# Patient Record
Sex: Female | Born: 1970
Health system: Southern US, Community
[De-identification: ages and names within clinical notes are randomized; demographics above are authoritative.]

## PROBLEM LIST (undated history)

## (undated) DIAGNOSIS — J683 Other acute and subacute respiratory conditions due to chemicals, gases, fumes and vapors: Secondary | ICD-10-CM

## (undated) DIAGNOSIS — R42 Dizziness and giddiness: Secondary | ICD-10-CM

## (undated) DIAGNOSIS — R011 Cardiac murmur, unspecified: Secondary | ICD-10-CM

## (undated) DIAGNOSIS — E669 Obesity, unspecified: Secondary | ICD-10-CM

## (undated) DIAGNOSIS — T7840XA Allergy, unspecified, initial encounter: Secondary | ICD-10-CM

## (undated) DIAGNOSIS — K219 Gastro-esophageal reflux disease without esophagitis: Secondary | ICD-10-CM

## (undated) DIAGNOSIS — R51 Headache: Principal | ICD-10-CM

## (undated) HISTORY — DX: Cardiac murmur, unspecified: R01.1

## (undated) HISTORY — PX: TONSILLECTOMY AND ADENOIDECTOMY: SUR1326

## (undated) HISTORY — DX: Dizziness and giddiness: R42

## (undated) HISTORY — DX: Headache: R51

## (undated) HISTORY — DX: Gastro-esophageal reflux disease without esophagitis: K21.9

## (undated) HISTORY — PX: ENDOMETRIAL ABLATION: SHX621

## (undated) HISTORY — DX: Allergy, unspecified, initial encounter: T78.40XA

## (undated) HISTORY — DX: Obesity, unspecified: E66.9

## (undated) HISTORY — DX: Other acute and subacute respiratory conditions due to chemicals, gases, fumes and vapors: J68.3

---

## 2004-07-21 HISTORY — PX: FOOT SURGERY: SHX648

## 2009-05-09 ENCOUNTER — Ambulatory Visit (HOSPITAL_COMMUNITY): Admission: RE | Admit: 2009-05-09 | Discharge: 2009-05-09 | Payer: Self-pay | Admitting: Family Medicine

## 2010-09-27 ENCOUNTER — Other Ambulatory Visit (HOSPITAL_COMMUNITY): Payer: Self-pay | Admitting: Family Medicine

## 2010-09-27 DIAGNOSIS — E049 Nontoxic goiter, unspecified: Secondary | ICD-10-CM

## 2010-09-30 ENCOUNTER — Ambulatory Visit (HOSPITAL_COMMUNITY)
Admission: RE | Admit: 2010-09-30 | Discharge: 2010-09-30 | Disposition: A | Discharge status: Home / Self Care | Payer: BC Managed Care – PPO | Source: Ambulatory Visit | Attending: Family Medicine | Admitting: Family Medicine

## 2010-09-30 DIAGNOSIS — E049 Nontoxic goiter, unspecified: Secondary | ICD-10-CM

## 2010-11-13 ENCOUNTER — Ambulatory Visit (INDEPENDENT_AMBULATORY_CARE_PROVIDER_SITE_OTHER): Payer: BC Managed Care – PPO | Admitting: Internal Medicine

## 2010-11-13 ENCOUNTER — Other Ambulatory Visit (INDEPENDENT_AMBULATORY_CARE_PROVIDER_SITE_OTHER): Payer: Self-pay | Admitting: Internal Medicine

## 2010-11-13 DIAGNOSIS — K219 Gastro-esophageal reflux disease without esophagitis: Secondary | ICD-10-CM

## 2010-11-13 DIAGNOSIS — R1310 Dysphagia, unspecified: Secondary | ICD-10-CM

## 2010-11-14 ENCOUNTER — Ambulatory Visit (HOSPITAL_COMMUNITY)
Admission: RE | Admit: 2010-11-14 | Discharge: 2010-11-14 | Disposition: A | Discharge status: Home / Self Care | Payer: BC Managed Care – PPO | Source: Ambulatory Visit | Attending: Internal Medicine | Admitting: Internal Medicine

## 2010-11-14 DIAGNOSIS — R1310 Dysphagia, unspecified: Secondary | ICD-10-CM | POA: Insufficient documentation

## 2010-11-14 DIAGNOSIS — K219 Gastro-esophageal reflux disease without esophagitis: Secondary | ICD-10-CM | POA: Insufficient documentation

## 2010-12-19 ENCOUNTER — Encounter (HOSPITAL_BASED_OUTPATIENT_CLINIC_OR_DEPARTMENT_OTHER): Payer: BC Managed Care – PPO | Admitting: Internal Medicine

## 2010-12-19 ENCOUNTER — Ambulatory Visit (HOSPITAL_COMMUNITY)
Admission: RE | Admit: 2010-12-19 | Discharge: 2010-12-19 | Disposition: A | Discharge status: Home / Self Care | Payer: BC Managed Care – PPO | Source: Ambulatory Visit | Attending: Internal Medicine | Admitting: Internal Medicine

## 2010-12-19 DIAGNOSIS — K219 Gastro-esophageal reflux disease without esophagitis: Secondary | ICD-10-CM

## 2010-12-19 DIAGNOSIS — R1310 Dysphagia, unspecified: Secondary | ICD-10-CM

## 2010-12-19 DIAGNOSIS — R1013 Epigastric pain: Secondary | ICD-10-CM | POA: Insufficient documentation

## 2011-01-13 NOTE — Op Note (Signed)
NAME:  Ana Gross, Ana Gross             ACCOUNT NO.:  [id number]  MEDICAL RECORD NO.:  [id number]           PATIENT TYPE:  O  LOCATION:  [facility]                          FACILITY:  [facility]  PHYSICIAN:  Lionel December, M.D.    DATE OF BIRTH:  Dec 08, 1970  DATE OF PROCEDURE:  12/19/2010 DATE OF DISCHARGE:                              OPERATIVE REPORT   PROCEDURE:  Esophagogastroduodenoscopy.  INDICATION:  Ingra is a 40 year old Caucasian female who has been having "rawness" in her chest for about 6 months.  This is worse in the morning.  She has been treated for GERD and currently on omeprazole twice daily, but does not report any improvement.  She denies dysphagia, heartburn, nausea, or vomiting.  She does not experience any breathing difficulty or postural symptoms.  She is undergoing diagnostic EGD to find out if she has erosive esophagitis or peptic ulcer disease that might explain her symptoms.  Procedure risks were reviewed with the patient.  Informed consent was obtained.  MEDICATIONS FOR CONSCIOUS SEDATION:  Cetacaine spray for pharyngeal topical anesthesia, Demerol 50 mg IV, and Versed 5 mg IV.  FINDINGS:  Procedure performed in endoscopy suite.  The patient's vital signs and O2 saturations were monitored during the procedure and remained stable.  The patient was placed in left lateral recumbent position and Pentax videoscope was passed via oropharynx without any difficulty into esophagus.  Esophagus:  Mucosa of the esophagus was normal.  GE junction was located at 35 cm from the incisors and was unremarkable.  Stomach:  It had moderate amount of bile in it.  There was no food debris.  Stomach distended very well with insufflation.  Folds proximal stomach are normal.  Examination of mucosa at body, antrum, pyloric channel as well as angularis, fundus, and cardia was normal.  Duodenum:  Bulbar mucosa was normal, but just past the pylorus, there are few raised areas with appearance  suggestive of Brunner gland hypertrophy.  Scope was passed into second part of duodenum where mucosa and folds were normal.  Endoscope was withdrawn.  The patient tolerated the procedure well.  FINAL DIAGNOSES: 1. No evidence of erosive esophagitis or peptic ulcer disease. 2. Duodenal and gastric bile reflux which may or may not be     significant finding.  RECOMMENDATIONS: 1. She will continue antireflux measures for now. 2. She will take both doses of omeprazole or Prilosec 30 minutes     before evening meal. 3. Carafate 2 g p.o. at bedtime.  Prescription given for 50-month     supply with 1 refill. 4. The patient advised to keep a symptom diary and see if she can     pinpoint any triggers or events that make her symptoms worse or     better.  She will return for office visit with me in 6 weeks and     then we will determine the next step.          ______________________________ Lionel December, M.D.     NR/MEDQ  D:  12/19/2010  T:  12/20/2010  Job:  [id number]  cc:   Lorin Picket A. Gerda Diss, MD Fax: [phone]  Electronically Signed by Lionel December M.D. on 01/13/2011 10:02:33 AM

## 2011-01-20 ENCOUNTER — Other Ambulatory Visit (INDEPENDENT_AMBULATORY_CARE_PROVIDER_SITE_OTHER): Payer: Self-pay | Admitting: Internal Medicine

## 2011-01-20 ENCOUNTER — Ambulatory Visit (INDEPENDENT_AMBULATORY_CARE_PROVIDER_SITE_OTHER): Payer: BC Managed Care – PPO | Admitting: Internal Medicine

## 2011-01-20 DIAGNOSIS — R1011 Right upper quadrant pain: Secondary | ICD-10-CM

## 2011-01-20 DIAGNOSIS — K219 Gastro-esophageal reflux disease without esophagitis: Secondary | ICD-10-CM

## 2011-01-23 ENCOUNTER — Ambulatory Visit (HOSPITAL_COMMUNITY)
Admission: RE | Admit: 2011-01-23 | Discharge: 2011-01-23 | Disposition: A | Discharge status: Home / Self Care | Payer: BC Managed Care – PPO | Source: Ambulatory Visit | Attending: Internal Medicine | Admitting: Internal Medicine

## 2011-01-23 DIAGNOSIS — R1011 Right upper quadrant pain: Secondary | ICD-10-CM | POA: Insufficient documentation

## 2011-01-23 DIAGNOSIS — K219 Gastro-esophageal reflux disease without esophagitis: Secondary | ICD-10-CM

## 2011-07-23 ENCOUNTER — Encounter (INDEPENDENT_AMBULATORY_CARE_PROVIDER_SITE_OTHER): Payer: Self-pay | Admitting: *Deleted

## 2011-08-05 ENCOUNTER — Encounter (INDEPENDENT_AMBULATORY_CARE_PROVIDER_SITE_OTHER): Payer: Self-pay

## 2011-08-05 ENCOUNTER — Ambulatory Visit (INDEPENDENT_AMBULATORY_CARE_PROVIDER_SITE_OTHER): Payer: BC Managed Care – PPO | Admitting: Internal Medicine

## 2011-08-05 DIAGNOSIS — E559 Vitamin D deficiency, unspecified: Secondary | ICD-10-CM | POA: Insufficient documentation

## 2011-08-05 DIAGNOSIS — R011 Cardiac murmur, unspecified: Secondary | ICD-10-CM | POA: Insufficient documentation

## 2011-08-12 ENCOUNTER — Encounter (INDEPENDENT_AMBULATORY_CARE_PROVIDER_SITE_OTHER): Payer: Self-pay | Admitting: Internal Medicine

## 2011-08-12 ENCOUNTER — Ambulatory Visit (INDEPENDENT_AMBULATORY_CARE_PROVIDER_SITE_OTHER): Payer: Self-pay | Admitting: Internal Medicine

## 2011-08-12 VITALS — BP 110/82 | HR 72 | Temp 98.2°F | Ht 59.0 in | Wt 186.6 lb

## 2011-08-12 DIAGNOSIS — K219 Gastro-esophageal reflux disease without esophagitis: Secondary | ICD-10-CM

## 2011-08-12 MED ORDER — SUCRALFATE 1 GM/10ML PO SUSP: 1.0000 g | Freq: Four times a day (QID) | ORAL | Status: DC

## 2011-08-12 NOTE — Patient Instructions (Signed)
Carafate 1gm po as needed.

## 2011-08-12 NOTE — Progress Notes (Signed)
Subjective:     Patient ID: Ana Gross, female   DOB: 01/26/1971, 41 y.o.   MRN: [id number]  HPI Ana Gross is a 41 yr old female here today for f/u.  EGD May of last year. And she had duodenogastric bile reflux, but no evidence of erosive esophagitis or PUD.   She says she had a spell last night. She ate BBQ last night. She felt like someone had punched her in the stomach. She tool Maalox around 430pm which it did not help.  No fever.  She does have nausea occasionally after eating. Appetite is okay.  No weight loss. She does have epigastric tenderness.  Sometimes greens will give her indigestion. She takes omeprazole on a prn basis. She tells me that the omeprazole made her hips hurt. She was concerned about fractures.  Her father is in his 110s and has already suffered a hip fracture.  She takes Tums on a prn basis.  She is having a BM about every other day.   Review of Systems  See  hpi    Current Outpatient Prescriptions  Medication Sig Dispense Refill  . alum & mag hydroxide-simeth (MAALOX/MYLANTA) 200-200-20 MG/5ML suspension Take by mouth every 6 (six) hours as needed.      . bismuth subsalicylate (PEPTO BISMOL) 262 MG chewable tablet Chew 524 mg by mouth as needed.      . bismuth subsalicylate (PEPTO BISMOL) 262 MG/15ML suspension Take 15 mLs by mouth as needed.      . calcium carbonate (TUMS - DOSED IN MG ELEMENTAL CALCIUM) 500 MG chewable tablet Chew 1 tablet by mouth as needed.      . Cholecalciferol (VITAMIN D) 2000 UNITS CAPS Take 1 Units by mouth.      . DHA-EPA-Flaxseed Oil-Vitamin E (THERA TEARS NUTRITION PO) Take 1 tablet by mouth 3 (three) times daily.      Marland Kitchen Fexofenadine HCl (ALLEGRA PO) Take by mouth 1 day or 1 dose.      . Lactobacillus (ACIDOPHILUS PO) Take 1 capsule by mouth 1 day or 1 dose.      . multivitamin (THERAGRAN) per tablet Take 1 tablet by mouth daily.      . naproxen sodium (ANAPROX) 220 MG tablet Take 220 mg by mouth as needed.      Marland Kitchen omeprazole (PRILOSEC) 20 MG  capsule Take 20 mg by mouth as needed.       . Pseudoephedrine HCl (NASAL DECONGESTANT PO) Take by mouth as needed.      . Aspirin-Acetaminophen-Caffeine (GOODY HEADACHE PO) Take 1 each by mouth as needed.      . sucralfate (CARAFATE) 1 GM/10ML suspension Take 10 mLs (1 g total) by mouth 4 (four) times daily.  420 mL  1   Past Medical History  Diagnosis Date  . GERD (gastroesophageal reflux disease)   . Vitamin D deficiency   . Heart murmur    Past Surgical History  Procedure Date  . Tonsillectomy and adenoidectomy    History   Social History  . Marital Status: Married    Spouse Name: N/A    Number of Children: N/A  . Years of Education: N/A   Occupational History  . Not on file.   Social History Main Topics  . Smoking status: Never Smoker   . Smokeless tobacco: Not on file  . Alcohol Use: Yes     she drinks about once or twice a year.  . Drug Use: No  . Sexually Active: Not on file  Other Topics Concern  . Not on file   Social History Narrative  . No narrative on file   Family Status  Relation Status Death Age  . Mother Alive   . Father Alive   . Sister Deceased     drug over dose   Allergies  Allergen Reactions  . Iodine     Used in CT scans    Objective:   Physical Exam,    Filed Vitals:   08/12/11 1557  Height: 4\' 11"  (1.499 m)  Weight: 186 lb 9.6 oz (84.641 kg)    Alert and oriented. Skin warm and dry. Oral mucosa is moist.   . Sclera anicteric, conjunctivae is pink. Thyroid not enlarged. No cervical lymphadenopathy. Lungs clear. Heart regular rate and rhythm.  Abdomen is soft. Bowel sounds are positive. No hepatomegaly. No abdominal masses felt. Slight tenderness epigastric area.  No edema to lower extremities. Patient is alert and oriented.      Assessment:    GERD, which is really not controlled at this time.     Plan:    Rx for Carafate 1gm prn. HOB elevated.  Omeprazole prn for reflux. Do not eat after 6 pm.

## 2011-10-13 ENCOUNTER — Other Ambulatory Visit: Payer: Self-pay | Admitting: Family Medicine

## 2011-10-13 DIAGNOSIS — E041 Nontoxic single thyroid nodule: Secondary | ICD-10-CM

## 2011-10-16 ENCOUNTER — Ambulatory Visit (HOSPITAL_COMMUNITY)
Admission: RE | Admit: 2011-10-16 | Discharge: 2011-10-16 | Disposition: A | Discharge status: Home / Self Care | Payer: Managed Care, Other (non HMO) | Source: Ambulatory Visit | Attending: Family Medicine | Admitting: Family Medicine

## 2011-10-16 DIAGNOSIS — E041 Nontoxic single thyroid nodule: Secondary | ICD-10-CM

## 2011-10-16 DIAGNOSIS — Z09 Encounter for follow-up examination after completed treatment for conditions other than malignant neoplasm: Secondary | ICD-10-CM | POA: Insufficient documentation

## 2011-10-16 DIAGNOSIS — E042 Nontoxic multinodular goiter: Secondary | ICD-10-CM | POA: Insufficient documentation

## 2012-02-05 ENCOUNTER — Encounter (INDEPENDENT_AMBULATORY_CARE_PROVIDER_SITE_OTHER): Payer: Self-pay | Admitting: *Deleted

## 2012-02-09 ENCOUNTER — Other Ambulatory Visit: Payer: Self-pay | Admitting: Family Medicine

## 2012-02-09 ENCOUNTER — Ambulatory Visit (HOSPITAL_COMMUNITY)
Admission: RE | Admit: 2012-02-09 | Discharge: 2012-02-09 | Disposition: A | Discharge status: Home / Self Care | Payer: Managed Care, Other (non HMO) | Source: Ambulatory Visit | Attending: Family Medicine | Admitting: Family Medicine

## 2012-02-09 DIAGNOSIS — S92919A Unspecified fracture of unspecified toe(s), initial encounter for closed fracture: Secondary | ICD-10-CM | POA: Insufficient documentation

## 2012-02-09 DIAGNOSIS — M79609 Pain in unspecified limb: Secondary | ICD-10-CM | POA: Insufficient documentation

## 2012-02-09 DIAGNOSIS — X58XXXA Exposure to other specified factors, initial encounter: Secondary | ICD-10-CM | POA: Insufficient documentation

## 2012-02-09 DIAGNOSIS — M79672 Pain in left foot: Secondary | ICD-10-CM

## 2012-02-16 ENCOUNTER — Ambulatory Visit (INDEPENDENT_AMBULATORY_CARE_PROVIDER_SITE_OTHER): Payer: Managed Care, Other (non HMO) | Admitting: Internal Medicine

## 2012-02-16 ENCOUNTER — Encounter (INDEPENDENT_AMBULATORY_CARE_PROVIDER_SITE_OTHER): Payer: Self-pay | Admitting: Internal Medicine

## 2012-02-16 VITALS — BP 110/72 | HR 72 | Ht 59.0 in | Wt 158.3 lb

## 2012-02-16 DIAGNOSIS — K219 Gastro-esophageal reflux disease without esophagitis: Secondary | ICD-10-CM

## 2012-02-16 MED ORDER — OMEPRAZOLE 20 MG PO CPDR: 20.0000 mg | DELAYED_RELEASE_CAPSULE | ORAL | Status: DC | PRN

## 2012-02-16 NOTE — Patient Instructions (Addendum)
Continue Carafate.Continue Omeprazole. OV in 1 yr.

## 2012-02-16 NOTE — Progress Notes (Signed)
Subjective:     Patient ID: Ana Gross, female   DOB: 03/03/1971, 41 y.o.   MRN: [id number]  HPI Ana Gross is here for a scheduled visit.  She has a hx of duodenogastric bowel reflux, but no evidence of acid reflux. She tells me she does good.  Her reflux is controlled with Carafate. She also has omeprazole on hand.  Appetite is good. No abdominal pain.  She usually has a BM daily. No melena or bright red rectal bleeding.  She knows which foods to avoid.  12/19/2010 OZH:[id number] DIAGNOSES:  1. No evidence of erosive esophagitis or peptic ulcer disease.  2. Duodenal and gastric bile reflux which may or may not be  significant finding.    Review of Systems see hpi Current Outpatient Prescriptions  Medication Sig Dispense Refill  . Aspirin-Acetaminophen-Caffeine (GOODY HEADACHE PO) Take 1 each by mouth as needed.      . calcium carbonate (TUMS - DOSED IN MG ELEMENTAL CALCIUM) 500 MG chewable tablet Chew 1 tablet by mouth as needed.      . Cholecalciferol (VITAMIN D) 2000 UNITS CAPS Take 1 Units by mouth.      . DHA-EPA-Flaxseed Oil-Vitamin E (THERA TEARS NUTRITION PO) Take 1 tablet by mouth 3 (three) times daily.      Marland Kitchen Fexofenadine HCl (ALLEGRA PO) Take by mouth 1 day or 1 dose.      . Lactobacillus (ACIDOPHILUS PO) Take 1 capsule by mouth 1 day or 1 dose.      . multivitamin (THERAGRAN) per tablet Take 1 tablet by mouth daily.      . Pseudoephedrine HCl (NASAL DECONGESTANT PO) Take by mouth as needed.      . sucralfate (CARAFATE) 1 GM/10ML suspension Take 10 mLs (1 g total) by mouth 4 (four) times daily.  420 mL  1  . alum & mag hydroxide-simeth (MAALOX/MYLANTA) 200-200-20 MG/5ML suspension Take by mouth every 6 (six) hours as needed.      . bismuth subsalicylate (PEPTO BISMOL) 262 MG chewable tablet Chew 524 mg by mouth as needed.      . bismuth subsalicylate (PEPTO BISMOL) 262 MG/15ML suspension Take 15 mLs by mouth as needed.      . naproxen sodium (ANAPROX) 220 MG tablet Take 220 mg by  mouth as needed.      Marland Kitchen omeprazole (PRILOSEC) 20 MG capsule Take 1 capsule (20 mg total) by mouth as needed.  30 capsule  5  . DISCONTD: omeprazole (PRILOSEC) 20 MG capsule Take 20 mg by mouth as needed.        Past Medical History  Diagnosis Date  . GERD (gastroesophageal reflux disease)   . Vitamin d deficiency   . Heart murmur    Past Surgical History  Procedure Date  . Tonsillectomy and adenoidectomy    Family Status  Relation Status Death Age  . Mother Alive   . Father Alive   . Sister Deceased     drug over dose   History   Social History  . Marital Status: Married    Spouse Name: N/A    Number of Children: N/A  . Years of Education: N/A   Occupational History  . Not on file.   Social History Main Topics  . Smoking status: Never Smoker   . Smokeless tobacco: Not on file  . Alcohol Use: Yes     she drinks about once or twice a year.  . Drug Use: No  . Sexually Active: Not on file  Other Topics Concern  . Not on file   Social History Narrative  . No narrative on file   Allergies  Allergen Reactions  . Iodine     Used in CT scans         Objective:   Physical Exam Filed Vitals:   02/16/12 1613  Height: 4\' 11"  (1.499 m)  Weight: 158 lb 4.8 oz (71.804 kg)  Alert and oriented. Skin warm and dry. Oral mucosa is moist.   . Sclera anicteric, conjunctivae is pink. Thyroid not enlarged. No cervical lymphadenopathy. Lungs clear. Heart regular rate and rhythm.  Abdomen is soft. Bowel sounds are positive. No hepatomegaly. No abdominal masses felt. No tenderness.  No edema to lower extremities.        Assessment:   GERD. Controlled at this time with Carafate. Occasional Prilosec.     Plan:   See PCP for Bone density. Prilosec 20mg  daily: eprescribed to pharmacy. Continue the Carafate.

## 2012-04-28 ENCOUNTER — Other Ambulatory Visit (INDEPENDENT_AMBULATORY_CARE_PROVIDER_SITE_OTHER): Payer: Self-pay | Admitting: Internal Medicine

## 2012-10-14 ENCOUNTER — Encounter: Payer: Self-pay | Admitting: Family Medicine

## 2012-10-14 ENCOUNTER — Ambulatory Visit (INDEPENDENT_AMBULATORY_CARE_PROVIDER_SITE_OTHER): Payer: Managed Care, Other (non HMO) | Admitting: Family Medicine

## 2012-10-14 VITALS — BP 120/80 | HR 70 | Ht 59.0 in | Wt 156.0 lb

## 2012-10-14 DIAGNOSIS — M549 Dorsalgia, unspecified: Secondary | ICD-10-CM

## 2012-10-14 MED ORDER — DICLOFENAC SODIUM 75 MG PO TBEC: 75.0000 mg | DELAYED_RELEASE_TABLET | Freq: Two times a day (BID) | ORAL | Status: AC

## 2012-10-14 NOTE — Patient Instructions (Addendum)
Use medicine as directed twice a day with food as needed for pain.

## 2012-10-14 NOTE — Progress Notes (Signed)
  Subjective:    Patient ID: Ana Gross, female    DOB: Jul 09, 1971, 42 y.o.   MRN: [id number]  HPI Feeling bloated in abd. Pain on the tailbone. Patient recalls no sudden injury. She did have coccyx injury with OB delivery. No fever no chills no dysuria. Took motrin prn. With delivery tailbone injury. Less activity. Motrin one otc tab prn.   Review of Systems    ROS otherwise negative Objective:   Physical Exam Alert vitals reviewed HEENT normal lungs clear. Heart regular rate and rhythm. Abdomen benign. Positive tenderness upon coccyx. No deformity       Assessment & Plan:  Impression coccygeal pain-etiology unclear. Plan Voltaren twice a day when necessary. Local measures discussed

## 2012-10-21 ENCOUNTER — Ambulatory Visit (INDEPENDENT_AMBULATORY_CARE_PROVIDER_SITE_OTHER): Payer: Managed Care, Other (non HMO) | Admitting: Otolaryngology

## 2012-10-21 DIAGNOSIS — H6990 Unspecified Eustachian tube disorder, unspecified ear: Secondary | ICD-10-CM

## 2012-10-21 DIAGNOSIS — H6980 Other specified disorders of Eustachian tube, unspecified ear: Secondary | ICD-10-CM

## 2012-10-21 DIAGNOSIS — J343 Hypertrophy of nasal turbinates: Secondary | ICD-10-CM

## 2012-10-21 DIAGNOSIS — J310 Chronic rhinitis: Secondary | ICD-10-CM

## 2012-10-30 ENCOUNTER — Encounter: Payer: Self-pay | Admitting: *Deleted

## 2012-11-18 ENCOUNTER — Ambulatory Visit (INDEPENDENT_AMBULATORY_CARE_PROVIDER_SITE_OTHER): Payer: Managed Care, Other (non HMO) | Admitting: Otolaryngology

## 2012-11-18 DIAGNOSIS — J310 Chronic rhinitis: Secondary | ICD-10-CM

## 2012-11-18 DIAGNOSIS — J343 Hypertrophy of nasal turbinates: Secondary | ICD-10-CM

## 2012-11-19 ENCOUNTER — Other Ambulatory Visit (INDEPENDENT_AMBULATORY_CARE_PROVIDER_SITE_OTHER): Payer: Self-pay | Admitting: Otolaryngology

## 2012-11-19 DIAGNOSIS — J329 Chronic sinusitis, unspecified: Secondary | ICD-10-CM

## 2012-11-26 ENCOUNTER — Ambulatory Visit (HOSPITAL_COMMUNITY)
Admission: RE | Admit: 2012-11-26 | Discharge: 2012-11-26 | Disposition: A | Discharge status: Home / Self Care | Payer: Managed Care, Other (non HMO) | Source: Ambulatory Visit | Attending: Otolaryngology | Admitting: Otolaryngology

## 2012-11-26 DIAGNOSIS — R51 Headache: Secondary | ICD-10-CM | POA: Insufficient documentation

## 2012-11-26 DIAGNOSIS — J329 Chronic sinusitis, unspecified: Secondary | ICD-10-CM | POA: Insufficient documentation

## 2012-12-24 ENCOUNTER — Encounter: Payer: Self-pay | Admitting: Neurology

## 2012-12-24 ENCOUNTER — Ambulatory Visit (INDEPENDENT_AMBULATORY_CARE_PROVIDER_SITE_OTHER): Payer: Managed Care, Other (non HMO) | Admitting: Neurology

## 2012-12-24 VITALS — BP 138/91 | HR 81 | Ht 59.0 in | Wt 158.0 lb

## 2012-12-24 DIAGNOSIS — R51 Headache: Secondary | ICD-10-CM

## 2012-12-24 DIAGNOSIS — R519 Headache, unspecified: Secondary | ICD-10-CM | POA: Insufficient documentation

## 2012-12-24 DIAGNOSIS — R42 Dizziness and giddiness: Secondary | ICD-10-CM

## 2012-12-24 HISTORY — DX: Dizziness and giddiness: R42

## 2012-12-24 HISTORY — DX: Headache: R51

## 2012-12-24 MED ORDER — TOPIRAMATE 25 MG PO TABS: 25.0000 mg | ORAL_TABLET | Freq: Every day | ORAL | Status: DC

## 2012-12-24 NOTE — Progress Notes (Signed)
Reason for visit: Headache  Ana Gross is a 42 y.o. female  History of present illness:  Ana Gross is a 42 year old right-handed white female with a history of what was felt to be sinus headaches. The patient indicates that these headaches have been present for years. The patient has had a severe bout of headache within the last several months, and she was seen through an ENT physician after several rounds of antibiotics and steroids do not improve her headache. A sinus CT scan did not show significant sinus disease. The patient is sent to this office for evaluation of these headaches. The patient indicates however, that the headaches have become less frequent over the last several weeks, and they are responding to over-the-counter medications. The patient indicates that the headaches are in the front and around the eyes, sometimes with a pressure sensation or a throbbing sensation. Occasionally, she will have photophobia and phonophobia. Occasionally, he will have some nausea. The patient also reports some recent episodes of dizziness when she turns her head too quickly. The patient denies any numbness or weakness of the face, arms, or legs. The patient denies any problems controlling the bowels or the bladder. The patient has not had any significant balance issues. The patient comes to this office for an evaluation.  Past Medical History  Diagnosis Date  . GERD (gastroesophageal reflux disease)   . Vitamin D deficiency   . Heart murmur   . Allergy   . Reactive airways dysfunction syndrome   . Headache(784.0) 12/24/2012  . Dizziness and giddiness 12/24/2012  . Obesity     Past Surgical History  Procedure Laterality Date  . Tonsillectomy and adenoidectomy    . Foot surgery Left 2006  . Endometrial ablation      Family History  Problem Relation Age of Onset  . COPD Mother   . Diabetes Mother   . Diabetes Father   . Hypertension Father   . Coronary artery disease Father   .  COPD Father   . Arthritis Father     Social history:  reports that she has never smoked. She does not have any smokeless tobacco history on file. She reports that  drinks alcohol. She reports that she does not use illicit drugs.  Medications:  Current Outpatient Prescriptions on File Prior to Visit  Medication Sig Dispense Refill  . alum & mag hydroxide-simeth (MAALOX/MYLANTA) 200-200-20 MG/5ML suspension Take by mouth every 6 (six) hours as needed.      . Aspirin-Acetaminophen-Caffeine (GOODY HEADACHE PO) Take 1 each by mouth as needed.      . bismuth subsalicylate (PEPTO BISMOL) 262 MG chewable tablet Chew 524 mg by mouth as needed.      Marland Kitchen CARAFATE 1 GM/10ML suspension TAKE 2 TEASPOONSFUL (10 MLS) BY MOUTH 4 (FOUR) TIMES DAILY.  420 mL  2  . Evening Primrose Oil 1000 MG CAPS Take by mouth.      . fluticasone (FLONASE) 50 MCG/ACT nasal spray Place 2 sprays into the nose daily.      Marland Kitchen omeprazole (PRILOSEC) 20 MG capsule Take 1 capsule (20 mg total) by mouth as needed.  30 capsule  5  . vitamin A 8000 UNIT capsule Take 8,000 Units by mouth daily.      . vitamin E 200 UNIT capsule Take 200 Units by mouth daily.       No current facility-administered medications on file prior to visit.    Allergies:  Allergies  Allergen Reactions  . Cefzil (Cefprozil)  Stomach pain  . Iodine     Used in CT scans  . Singulair (Montelukast Sodium)     dizzy    ROS:  Out of a complete 14 system review of symptoms, the patient complains only of the following symptoms, and all other reviewed systems are negative.  Heart murmur Birthmark, mole Snoring Achy muscles Allergies Headache, dizziness  Blood pressure 138/91, pulse 81, height 4\' 11"  (1.499 m), weight 158 lb (71.668 kg).  Physical Exam  General: The patient is alert and cooperative at the time of the examination.  Head: Pupils are equal, round, and reactive to light. Discs are flat bilaterally.  Neck: The neck is supple, no  carotid bruits are noted.  Respiratory: The respiratory examination is clear.  Cardiovascular: The cardiovascular examination reveals a regular rate and rhythm, no obvious murmurs or rubs are noted.  Skin: Extremities are without significant edema.  Neurologic Exam  Mental status:  Cranial nerves: Facial symmetry is present. There is good sensation of the face to pinprick and soft touch bilaterally. The strength of the facial muscles and the muscles to head turning and shoulder shrug are normal bilaterally. Speech is well enunciated, no aphasia or dysarthria is noted. Extraocular movements are full. Visual fields are full.  Motor: The motor testing reveals 5 over 5 strength of all 4 extremities. Good symmetric motor tone is noted throughout.  Sensory: Sensory testing is intact to pinprick, soft touch, vibration sensation, and position sense on all 4 extremities. No evidence of extinction is noted.  Coordination: Cerebellar testing reveals good finger-nose-finger and heel-to-shin bilaterally.  Gait and station: Gait is normal. Tandem gait is normal. Romberg is negative. No drift is seen.  Reflexes: Deep tendon reflexes are symmetric and normal bilaterally. Toes are downgoing bilaterally.   Assessment/Plan:  1. History of headache, possible migraine  2. History dizziness  The patient is concerned about the dizziness as well as the headache. The 2 are independent of one another. The patient will be set up for MRI of the brain. The patient will be placed on low-dose of Topamax. The patient followup in 3-4 months.  Marlan Palau MD 12/24/2012 6:55 PM  [facility] [address] [place], [place] [address]  Phone [phone] Fax [phone]

## 2013-01-11 ENCOUNTER — Encounter (INDEPENDENT_AMBULATORY_CARE_PROVIDER_SITE_OTHER): Payer: Self-pay | Admitting: *Deleted

## 2013-01-12 ENCOUNTER — Ambulatory Visit (INDEPENDENT_AMBULATORY_CARE_PROVIDER_SITE_OTHER): Payer: Managed Care, Other (non HMO)

## 2013-01-12 DIAGNOSIS — R51 Headache: Secondary | ICD-10-CM

## 2013-01-12 DIAGNOSIS — R42 Dizziness and giddiness: Secondary | ICD-10-CM

## 2013-01-14 ENCOUNTER — Telehealth: Payer: Self-pay | Admitting: Neurology

## 2013-01-14 DIAGNOSIS — R42 Dizziness and giddiness: Secondary | ICD-10-CM

## 2013-01-14 DIAGNOSIS — R9082 White matter disease, unspecified: Secondary | ICD-10-CM

## 2013-01-14 NOTE — Telephone Encounter (Signed)
I called the patient at that home and mobile numbers. I left messages. MRI the brain shows multiple white matter lesions that are subcortical and juxtacortical. This could be related to migraine, but we may need to evaluate her for demyelinating disease giving episodes of dizziness. I'll check blood work, and if this is unremarkable, we may consider lumbar puncture. I will call her back later.

## 2013-01-14 NOTE — Telephone Encounter (Signed)
I called the patient. I discussed the results of the MRI study. We will start with getting blood work. We may consider a lumbar puncture in the future.

## 2013-02-02 ENCOUNTER — Other Ambulatory Visit: Payer: Self-pay | Admitting: Neurology

## 2013-02-08 ENCOUNTER — Telehealth: Payer: Self-pay | Admitting: Neurology

## 2013-02-08 LAB — LUPUS ANTICOAGULANT
Dilute Viper Venom Time: 32.7 s (ref 0.0–55.1)
PTT Lupus Anticoagulant: 35.8 s (ref 0.0–50.0)

## 2013-02-08 LAB — FACTOR 5 LEIDEN

## 2013-02-08 LAB — CARDIOLIPIN ANTIBODY
Anticardiolipin IgA: <9 APL U/mL (ref 0–11)
Anticardiolipin IgM: 11 MPL U/mL (ref 0–12)

## 2013-02-08 LAB — LYME DISEASE, WESTERN BLOT
IgG P18 Ab.: ABSENT
IgG P28 Ab.: ABSENT
IgG P30 Ab.: ABSENT
IgG P39 Ab.: ABSENT
Lyme IgG Wb: NEGATIVE
Lyme IgM Wb: NEGATIVE

## 2013-02-08 LAB — ANGIOTENSIN CONVERTING ENZYME: Angio Convert Enzyme: 27 U/L (ref 14–82)

## 2013-02-08 LAB — LYME, TOTAL AB TEST/REFLEX
Lyme Ab Interp.,EIA: POSITIVE — AB
Lyme Ab: 1.53 index — ABNORMAL HIGH (ref 0.00–0.90)

## 2013-02-08 LAB — RHEUMATOID FACTOR: Rhuematoid fact SerPl-aCnc: 7.2 IU/mL (ref 0.0–13.9)

## 2013-02-08 NOTE — Telephone Encounter (Signed)
I called patient. The blood work was unremarkable. I would recommend pursuing a lumbar puncture. The patient will contact me if she believes that she wants to have spinal tap done. Otherwise, I would follow the MRI study over time to look for changes.

## 2013-02-15 DIAGNOSIS — Z0289 Encounter for other administrative examinations: Secondary | ICD-10-CM

## 2013-04-19 ENCOUNTER — Ambulatory Visit (INDEPENDENT_AMBULATORY_CARE_PROVIDER_SITE_OTHER): Payer: Managed Care, Other (non HMO) | Admitting: Internal Medicine

## 2013-04-19 ENCOUNTER — Encounter (INDEPENDENT_AMBULATORY_CARE_PROVIDER_SITE_OTHER): Payer: Self-pay | Admitting: Internal Medicine

## 2013-04-19 VITALS — BP 128/74 | HR 72 | Temp 98.4°F | Resp 18 | Ht 59.0 in | Wt 157.3 lb

## 2013-04-19 DIAGNOSIS — K219 Gastro-esophageal reflux disease without esophagitis: Secondary | ICD-10-CM

## 2013-04-19 MED ORDER — PANTOPRAZOLE SODIUM 40 MG PO TBEC: 40.0000 mg | DELAYED_RELEASE_TABLET | Freq: Every day | ORAL | Status: DC | PRN

## 2013-04-19 NOTE — Progress Notes (Signed)
Presenting complaint;  Follow for chronic GERD.  Database; She had EGD on 12/19/2010 revealing normal esophageal mucosa and duodenogastric bile reflux.  Subjective; Patient is 42 year old Caucasian female who has chronic GERD and is here for yearly visit. She was last seen in July 2013. She states she is doing well. She has heartburn no more than once a week. Heartburn generally occurs if she takes Goody powder or eats the foods that she should not need to begin with. She has occasional regurgitation. She denies cough or sore throat. Only time she has hoarseness has been she uses Flonase. She takes omeprazole no more than once or twice a week. She has noted polyarthralgia and uses omeprazole. She has taken Prevacid in the past which resulted in headache. She's also using a few doses of Carafate per month. Her bowels move regularly and she denies melena or rectal bleeding.   Current Medications: Current Outpatient Prescriptions  Medication Sig Dispense Refill  . Acetaminophen (TYLENOL PO) Take by mouth. As needed      . Aspirin-Acetaminophen-Caffeine (GOODY HEADACHE PO) Take 1 each by mouth as needed.      Marland Kitchen BIOTIN PO Take 1,000 mcg by mouth daily.      . Calcium Carbonate-Vitamin D (CALTRATE 600+D PO) Take by mouth daily. 600 D-3      . CARAFATE 1 GM/10ML suspension TAKE 2 TEASPOONSFUL (10 MLS) BY MOUTH 4 (FOUR) TIMES DAILY.  420 mL  2  . Cyanocobalamin (VITAMIN B 12 PO) Take 1,000 mcg by mouth daily.      . Evening Primrose Oil 1000 MG CAPS Take by mouth.      . fexofenadine (ALLEGRA) 180 MG tablet Take 180 mg by mouth daily as needed.      . fluticasone (FLONASE) 50 MCG/ACT nasal spray Place 2 sprays into the nose daily.      . Lutein 6 MG CAPS Take by mouth daily. This is a gel cap      . Omega-3 Fatty Acids (OMEGA 3 PO) Take by mouth 2 (two) times daily. ProOmega, 650/EPA-450/DHA,      . omeprazole (PRILOSEC) 20 MG capsule Take 1 capsule (20 mg total) by mouth as needed.  30 capsule  5   . Probiotic Product (PROBIOTIC PEARLS PO) Take by mouth daily.      . RESTASIS 0.05 % ophthalmic emulsion Place 1 drop into both eyes 2 (two) times daily.       . vitamin A 8000 UNIT capsule Take 8,000 Units by mouth every other day.       . vitamin C (ASCORBIC ACID) 500 MG tablet Take 500 mg by mouth daily.      . vitamin E 200 UNIT capsule Take 200 Units by mouth daily.       No current facility-administered medications for this visit.     Objective: Blood pressure 128/74, pulse 72, temperature 98.4 F (36.9 C), temperature source Oral, resp. rate 18, height 4\' 11"  (1.499 m), weight 157 lb 4.8 oz (71.351 kg). Conjunctiva is pink. Sclera is nonicteric Oropharyngeal mucosa is normal. No neck masses or thyromegaly noted. Cardiac exam with regular rhythm normal S1 and S2. No murmur or gallop noted. Lungs are clear to auscultation. Abdomen symmetrical soft and nontender without organomegaly or masses. No LE edema or clubbing noted.   Assessment:  #1. GERD. She is doing well with dietary measures and sporadic use of PPI and sucralfate. She appears to have joint pains every time she takes omeprazole.   Plan:  Discontinue omeprazole. Pantoprazole 40 mg by mouth daily when necessary. Unless symptoms relapsed she will return for office visit in two years.

## 2013-04-19 NOTE — Patient Instructions (Signed)
Call if you spirit and side effects with pantoprazole.

## 2013-05-13 ENCOUNTER — Ambulatory Visit: Payer: Self-pay | Admitting: Nurse Practitioner

## 2013-05-26 ENCOUNTER — Other Ambulatory Visit: Payer: Self-pay

## 2013-06-03 ENCOUNTER — Encounter: Payer: Self-pay | Admitting: Nurse Practitioner

## 2013-06-03 ENCOUNTER — Ambulatory Visit (INDEPENDENT_AMBULATORY_CARE_PROVIDER_SITE_OTHER): Payer: Managed Care, Other (non HMO) | Admitting: Nurse Practitioner

## 2013-06-03 VITALS — BP 128/85 | HR 69 | Temp 98.0°F | Ht 59.0 in | Wt 160.0 lb

## 2013-06-03 DIAGNOSIS — R209 Unspecified disturbances of skin sensation: Secondary | ICD-10-CM

## 2013-06-03 DIAGNOSIS — R9082 White matter disease, unspecified: Secondary | ICD-10-CM

## 2013-06-03 DIAGNOSIS — G35 Multiple sclerosis: Secondary | ICD-10-CM

## 2013-06-03 DIAGNOSIS — R930 Abnormal findings on diagnostic imaging of skull and head, not elsewhere classified: Secondary | ICD-10-CM

## 2013-06-03 DIAGNOSIS — R200 Anesthesia of skin: Secondary | ICD-10-CM

## 2013-06-03 DIAGNOSIS — R42 Dizziness and giddiness: Secondary | ICD-10-CM

## 2013-06-03 NOTE — Progress Notes (Signed)
Gross: Ana Gross DOB: 04/02/1971  REASON FOR VISIT: follow up HISTORY FROM: Gross  HISTORY OF PRESENT ILLNESS: Ana Gross is a 42 year old right-handed white female with a history of what was felt to be sinus headaches. Ana Gross indicates that these headaches have been present for years. Ana Gross has had a severe bout of headache within Ana last several months, and she was seen through an ENT physician after several rounds of antibiotics and steroids do not improve her headache. A sinus CT scan did not show significant sinus disease. Ana Gross is sent to this office for evaluation of these headaches. Ana Gross indicates however, that Ana headaches have become less frequent over Ana last several weeks, and they are responding to over-Ana-counter medications. Ana Gross indicates that Ana headaches are in Ana front and around Ana eyes, sometimes with a pressure sensation or a throbbing sensation. Occasionally, she will have photophobia and phonophobia. Occasionally, he will have some nausea. Ana Gross also reports some recent episodes of dizziness when she turns her head too quickly. Ana Gross denies any numbness or weakness of Ana face, arms, or legs. Ana Gross denies any problems controlling Ana bowels or Ana bladder. Ana Gross has not had any significant balance issues. Ana Gross comes to this office for an evaluation.  UPDATE 06/03/13 (LL):  Gross comes in for follow up and to discuss test results.  Her headaches and dizziness are infrequent now that Ana weather is cooler.  She never started Ana Topamax prescribed at last visit due to side effect profile.  She has tightness in Ana muscles of her right arm and shoulder, which she thinks may be associated to her job.  Otherwise, she has no complaints.  ROS:  Out of a complete 14 system review of symptoms, Ana Gross complains only of Ana following symptoms, and all other reviewed systems are negative.  Swelling  in legs Birthmark, mole  Snoring  Achy muscles, joint swelling, joint pain  ALLERGIES: Allergies  Allergen Reactions  . Cefzil [Cefprozil]     Stomach pain  . Iodine     Used in CT scans  . Singulair [Montelukast Sodium]     dizzy    HOME MEDICATIONS: Outpatient Prescriptions Prior to Visit  Medication Sig Dispense Refill  . Acetaminophen (TYLENOL PO) Take by mouth. As needed      . Aspirin-Acetaminophen-Caffeine (GOODY HEADACHE PO) Take 1 each by mouth as needed.      Marland Kitchen BIOTIN PO Take 1,000 mcg by mouth daily.      . Calcium Carbonate-Vitamin D (CALTRATE 600+D PO) Take by mouth daily. 600 D-3      . CARAFATE 1 GM/10ML suspension TAKE 2 TEASPOONSFUL (10 MLS) BY MOUTH 4 (FOUR) TIMES DAILY.  420 mL  2  . Cyanocobalamin (VITAMIN B 12 PO) Take 1,000 mcg by mouth daily.      . Evening Primrose Oil 1000 MG CAPS Take by mouth daily.       . fexofenadine (ALLEGRA) 180 MG tablet Take 180 mg by mouth daily as needed.      . fluticasone (FLONASE) 50 MCG/ACT nasal spray Place 2 sprays into Ana nose daily.      . pantoprazole (PROTONIX) 40 MG tablet Take 1 tablet (40 mg total) by mouth daily as needed.  30 tablet  5  . Probiotic Product (PROBIOTIC PEARLS PO) Take by mouth daily.      . RESTASIS 0.05 % ophthalmic emulsion Place 1 drop into both eyes 2 (  two) times daily.       . vitamin C (ASCORBIC ACID) 500 MG tablet Take 500 mg by mouth daily.      . vitamin E 200 UNIT capsule Take 200 Units by mouth daily.      . Lutein 6 MG CAPS Take by mouth daily. This is a gel cap      . Omega-3 Fatty Acids (OMEGA 3 PO) Take by mouth 2 (two) times daily. ProOmega, 650/EPA-450/DHA,      . vitamin A 8000 UNIT capsule Take 8,000 Units by mouth every other day.        No facility-administered medications prior to visit.    PAST MEDICAL HISTORY: Past Medical History  Diagnosis Date  . GERD (gastroesophageal reflux disease)   . Vitamin D deficiency   . Heart murmur   . Allergy   . Reactive airways  dysfunction syndrome   . Headache(784.0) 12/24/2012  . Dizziness and giddiness 12/24/2012  . Obesity     PAST SURGICAL HISTORY: Past Surgical History  Procedure Laterality Date  . Tonsillectomy and adenoidectomy    . Foot surgery Left 2006  . Endometrial ablation      FAMILY HISTORY: Family History  Problem Relation Age of Onset  . COPD Mother   . Diabetes Mother   . Diabetes Father   . Hypertension Father   . Coronary artery disease Father   . COPD Father   . Arthritis Father     SOCIAL HISTORY: History   Social History  . Marital Status: Married    Spouse Name: N/A    Number of Children: 2  . Years of Education: 14   Occupational History  .  [employer]   Social History Main Topics  . Smoking status: Never Smoker   . Smokeless tobacco: Never Used  . Alcohol Use: Yes     Comment: she drinks about once or twice a year.  . Drug Use: No  . Sexual Activity: Yes   Other Topics Concern  . Not on file   Social History Narrative  . No narrative on file     PHYSICAL EXAM  Filed Vitals:   06/03/13 0921  BP: 128/85  Pulse: 69  Temp: 98 F (36.7 C)  TempSrc: Oral  Height: 4\' 11"  (1.499 m)  Weight: 160 lb (72.576 kg)   Body mass index is 32.3 kg/(m^2).  Generalized: Well developed, in no acute distress  Head: normocephalic and atraumatic. Oropharynx benign  Neck: Supple, no carotid bruits  Cardiac: Regular rate rhythm, no murmur  Musculoskeletal: No deformity   Neurological examination  Mentation: Alert oriented to time, place, history taking. Follows all commands speech and language fluent Cranial nerve II-XII:   Pupils were equal round reactive to light extraocular movements were full, visual field were full on confrontational test. Facial sensation and strength were normal. hearing was intact to finger rubbing bilaterally. Uvula tongue midline. head turning and shoulder shrug and were normal and symmetric.Tongue protrusion into cheek strength was  normal. Motor: normal bulk and tone, full strength in Ana BUE, BLE, fine finger movements normal, no pronator drift. No focal weakness Sensory: normal and symmetric to light touch, pinprick, and  vibration  Coordination: finger-nose-finger, heel-to-shin bilaterally, no dysmetria Reflexes:  Deep tendon reflexes in Ana upper and lower extremities are present and symmetric.  Gait and Station: Rising up from seated position without assistance, normal stance, without trunk ataxia, moderate stride, good arm swing, smooth turning, able to perform tiptoe, and heel  walking without difficulty.   DIAGNOSTIC DATA (LABS, IMAGING, TESTING) - I reviewed Gross records, labs, notes, testing and imaging myself where available. Lupus, ANA, and anticardiolipin AB negative, Lyme Ab positive, Lyme IgG and IgM negative.  MRI brain (without) 01/13/13 Multiple scattered, round and ovoid periventricular, subcortical and juxtacortical T2 hyperintensities. These findings are non-specific and considerations include autoimmune, inflammatory, post-infectious, microvascular ischemic or migraine associated etiologies.  No acute findings.   ASSESSMENT AND PLAN 42 y.o. year old female  has a past medical history of GERD (gastroesophageal reflux disease); Vitamin D deficiency; Heart murmur; Allergy; Reactive airways dysfunction syndrome; WGNFAOZH(086.5) (12/24/2012); Dizziness and giddiness (12/24/2012); and Obesity here for follow up of MRI which showed multiple scattered, round and ovoid periventricular, subcortical and juxtacortical T2 hyper intensities, concerning for MS.  1. History of headache, possible migraine  2. History dizziness  3. Right shoulder muscle tightness 4. Abnormal MRI of Ana Brain, Multiple scattered, round and ovoid periventricular, subcortical and juxtacortical T2 hyperintensities. 5. Check Visual evoked potentials test. 6. Ana Gross followup in 3 months.   Orders Placed This Encounter  Procedures  .  MR Cervical Spine W Wo Contrast  . Visual evoked potential test   Tawny Asal Kashawn Dirr, MSN, NP-C 06/03/2013, 10:18 AM [facility] [address], [address] [place], [place] [address] [phone]  Note: This document was prepared with digital dictation and possible smart phrase technology. Any transcriptional errors that result from this process are unintentional.

## 2013-06-03 NOTE — Progress Notes (Signed)
I have read the note, and I agree with the clinical assessment and plan.  Abeera Flannery KEITH   

## 2013-06-03 NOTE — Patient Instructions (Signed)
We are going to schedule the Visual evoked potential test and MRI of cervical spine to evaluate for signs of Multiple Sclerosis.  Follow up with Dr. Anne Hahn after imaging to discuss results.

## 2013-06-09 ENCOUNTER — Ambulatory Visit (INDEPENDENT_AMBULATORY_CARE_PROVIDER_SITE_OTHER): Payer: Managed Care, Other (non HMO)

## 2013-06-09 ENCOUNTER — Other Ambulatory Visit: Payer: Self-pay | Admitting: Nurse Practitioner

## 2013-06-09 ENCOUNTER — Telehealth: Payer: Self-pay | Admitting: Neurology

## 2013-06-09 DIAGNOSIS — G35 Multiple sclerosis: Secondary | ICD-10-CM

## 2013-06-09 DIAGNOSIS — R200 Anesthesia of skin: Secondary | ICD-10-CM

## 2013-06-09 DIAGNOSIS — R9089 Other abnormal findings on diagnostic imaging of central nervous system: Secondary | ICD-10-CM

## 2013-06-09 DIAGNOSIS — R209 Unspecified disturbances of skin sensation: Secondary | ICD-10-CM

## 2013-06-09 DIAGNOSIS — R930 Abnormal findings on diagnostic imaging of skull and head, not elsewhere classified: Secondary | ICD-10-CM

## 2013-06-09 NOTE — Procedures (Signed)
    History:   Ana Gross is a 42 year old patient with a history of headaches. MRI the brain has shown multiple ovoid white matter abnormalities, and the patient is being evaluated for possible demyelinating disease.  Description: The visual evoked response test was performed today using 32 x 32 check sizes. The absolute latencies for the N1 and the P100 wave forms were within normal limits bilaterally. The amplitudes for the P100 wave forms were also within normal limits bilaterally. The visual acuity was not documented.  Impression:  The visual evoked response test above was within normal limits bilaterally. No evidence of conduction slowing was seen within the anterior visual pathways on either side on today's evaluation.

## 2013-06-09 NOTE — Telephone Encounter (Signed)
I called  The patient. The visual evoked response test was unremarkable. I will contact her once I get the MRI cervical spine results.

## 2013-07-12 ENCOUNTER — Ambulatory Visit (INDEPENDENT_AMBULATORY_CARE_PROVIDER_SITE_OTHER): Payer: Managed Care, Other (non HMO) | Admitting: Family Medicine

## 2013-07-12 ENCOUNTER — Encounter: Payer: Self-pay | Admitting: Family Medicine

## 2013-07-12 VITALS — BP 102/68 | Temp 99.9°F | Ht 59.0 in | Wt 158.0 lb

## 2013-07-12 DIAGNOSIS — J329 Chronic sinusitis, unspecified: Secondary | ICD-10-CM

## 2013-07-12 MED ORDER — AMOXICILLIN 500 MG PO TABS: ORAL_TABLET | ORAL | Status: DC

## 2013-07-12 NOTE — Progress Notes (Signed)
   Subjective:    Patient ID: Ana Gross, female    DOB: 28-Oct-1970, 42 y.o.   MRN: [id number]  Cough This is a new problem. The current episode started 1 to 4 weeks ago. Associated symptoms include a fever and headaches.   Facial pain, yest started with a feve5 and cough  Ears stopped up  [employer] and ma sinus   Neg GI symptoms   Review of Systems  Constitutional: Positive for fever.  Respiratory: Positive for cough.   Neurological: Positive for headaches.   No vomiting no diarrhea no rash ROS otherwise negative    Objective:   Physical Exam Alert moderate malaise hydration good. H&T moderate his congestion frontal tenderness neck supple. Lungs clear heart regular in rhythm.       Assessment & Plan:  Impression 1 acute rhinosinusitis plan a mocks 500 3 times a day 10 days. Symptomatic care discussed. Local measures discussed. WSL

## 2013-08-08 ENCOUNTER — Other Ambulatory Visit (INDEPENDENT_AMBULATORY_CARE_PROVIDER_SITE_OTHER): Payer: Self-pay | Admitting: Internal Medicine

## 2013-08-15 ENCOUNTER — Ambulatory Visit (INDEPENDENT_AMBULATORY_CARE_PROVIDER_SITE_OTHER): Payer: Managed Care, Other (non HMO) | Admitting: Nurse Practitioner

## 2013-08-15 ENCOUNTER — Encounter: Payer: Self-pay | Admitting: Nurse Practitioner

## 2013-08-15 VITALS — BP 120/82 | Temp 98.7°F | Ht 59.0 in | Wt 161.2 lb

## 2013-08-15 DIAGNOSIS — J320 Chronic maxillary sinusitis: Secondary | ICD-10-CM

## 2013-08-15 MED ORDER — METHYLPREDNISOLONE ACETATE 40 MG/ML IJ SUSP
40.0000 mg | Freq: Once | INTRAMUSCULAR | Status: AC
  Administered 2013-08-15: 40 mg via INTRAMUSCULAR

## 2013-08-15 MED ORDER — PREDNISONE 20 MG PO TABS: ORAL_TABLET | ORAL | Status: DC

## 2013-08-15 MED ORDER — LEVOFLOXACIN 500 MG PO TABS: 500.0000 mg | ORAL_TABLET | Freq: Every day | ORAL | Status: DC

## 2013-08-19 ENCOUNTER — Encounter: Payer: Self-pay | Admitting: Nurse Practitioner

## 2013-08-19 NOTE — Progress Notes (Signed)
Subjective:  Presents with c/o sinus symptoms for several weeks, worse x 1 week. No fever, occas cough. Slight yellow nasal drainage. PND. Facial/maxillary area headache. Ear pain. Sore throat. No wheezing. No relief with allegra or flonase.   Objective:   BP 120/82  Temp(Src) 98.7 F (37.1 C) (Oral)  Ht 4\' 11"  (1.499 m)  Wt 161 lb 4 oz (73.143 kg)  BMI 32.55 kg/m2 NAD. Alert, oriented. TMs clear effusion. Pharynx injected with PND noted. Nasal mucosa pale and boggy. Neck supple with mild anterior adenopathy. Lungs clear. Heart RRR.  Assessment: Maxillary sinusitis - Plan: methylPREDNISolone acetate (DEPO-MEDROL) injection 40 mg  Plan:  Meds ordered this encounter  Medications  . sucralfate (CARAFATE) 1 GM/10ML suspension    Sig: TAKE 2 TEASPOONSFUL BY MOUTH 4 TIMES DAILY.prn  . Fish Oil OIL    Sig: Take by mouth.  . methylPREDNISolone acetate (DEPO-MEDROL) injection 40 mg    Sig:   . predniSONE (DELTASONE) 20 MG tablet    Sig: 3 po qd x 3 d then 2 po qd x 3 d then 1 po qd x 3 d    Dispense:  18 tablet    Refill:  0    Order Specific Question:  Supervising Provider    Answer:  Mikey Kirschner [[id number]]  . levofloxacin (LEVAQUIN) 500 MG tablet    Sig: Take 1 tablet (500 mg total) by mouth daily.    Dispense:  10 tablet    Refill:  0    Order Specific Question:  Supervising Provider    Answer:  Mikey Kirschner [[id number]]  continue Flonase and Allegra as directed. OTC meds as directed for congestion. Call back if worsens or persists.

## 2013-08-29 ENCOUNTER — Other Ambulatory Visit (INDEPENDENT_AMBULATORY_CARE_PROVIDER_SITE_OTHER): Payer: Self-pay | Admitting: Internal Medicine

## 2013-09-15 ENCOUNTER — Ambulatory Visit (INDEPENDENT_AMBULATORY_CARE_PROVIDER_SITE_OTHER): Payer: Managed Care, Other (non HMO) | Admitting: Otolaryngology

## 2013-09-15 DIAGNOSIS — J320 Chronic maxillary sinusitis: Secondary | ICD-10-CM

## 2013-09-15 DIAGNOSIS — J310 Chronic rhinitis: Secondary | ICD-10-CM

## 2013-09-15 DIAGNOSIS — J343 Hypertrophy of nasal turbinates: Secondary | ICD-10-CM

## 2013-09-16 ENCOUNTER — Other Ambulatory Visit (INDEPENDENT_AMBULATORY_CARE_PROVIDER_SITE_OTHER): Payer: Self-pay | Admitting: Otolaryngology

## 2013-09-16 DIAGNOSIS — J329 Chronic sinusitis, unspecified: Secondary | ICD-10-CM

## 2013-09-20 ENCOUNTER — Ambulatory Visit (HOSPITAL_COMMUNITY)
Admission: RE | Admit: 2013-09-20 | Discharge: 2013-09-20 | Disposition: A | Discharge status: Home / Self Care | Payer: Managed Care, Other (non HMO) | Source: Ambulatory Visit | Attending: Otolaryngology | Admitting: Otolaryngology

## 2013-09-20 DIAGNOSIS — J329 Chronic sinusitis, unspecified: Secondary | ICD-10-CM

## 2013-09-29 ENCOUNTER — Ambulatory Visit (INDEPENDENT_AMBULATORY_CARE_PROVIDER_SITE_OTHER): Payer: Managed Care, Other (non HMO) | Admitting: Otolaryngology

## 2013-09-29 DIAGNOSIS — J310 Chronic rhinitis: Secondary | ICD-10-CM

## 2013-09-29 DIAGNOSIS — J343 Hypertrophy of nasal turbinates: Secondary | ICD-10-CM

## 2013-12-02 ENCOUNTER — Ambulatory Visit: Payer: Managed Care, Other (non HMO) | Admitting: Neurology

## 2013-12-16 ENCOUNTER — Ambulatory Visit: Payer: Self-pay | Admitting: Nurse Practitioner

## 2014-05-18 ENCOUNTER — Ambulatory Visit (INDEPENDENT_AMBULATORY_CARE_PROVIDER_SITE_OTHER): Payer: Managed Care, Other (non HMO) | Admitting: Otolaryngology

## 2014-05-18 DIAGNOSIS — J343 Hypertrophy of nasal turbinates: Secondary | ICD-10-CM

## 2014-05-18 DIAGNOSIS — J0101 Acute recurrent maxillary sinusitis: Secondary | ICD-10-CM

## 2014-06-08 ENCOUNTER — Ambulatory Visit (INDEPENDENT_AMBULATORY_CARE_PROVIDER_SITE_OTHER): Payer: Managed Care, Other (non HMO) | Admitting: Otolaryngology

## 2014-06-08 DIAGNOSIS — J0101 Acute recurrent maxillary sinusitis: Secondary | ICD-10-CM

## 2014-06-08 DIAGNOSIS — J343 Hypertrophy of nasal turbinates: Secondary | ICD-10-CM

## 2014-08-18 ENCOUNTER — Encounter: Payer: Self-pay | Admitting: Family Medicine

## 2014-08-18 ENCOUNTER — Ambulatory Visit (INDEPENDENT_AMBULATORY_CARE_PROVIDER_SITE_OTHER): Payer: Managed Care, Other (non HMO) | Admitting: Family Medicine

## 2014-08-18 VITALS — BP 134/86 | Temp 99.1°F | Ht 59.0 in | Wt 153.0 lb

## 2014-08-18 DIAGNOSIS — R5383 Other fatigue: Secondary | ICD-10-CM

## 2014-08-18 DIAGNOSIS — Z13220 Encounter for screening for lipoid disorders: Secondary | ICD-10-CM

## 2014-08-18 DIAGNOSIS — R61 Generalized hyperhidrosis: Secondary | ICD-10-CM

## 2014-08-18 NOTE — Progress Notes (Signed)
   Subjective:    Patient ID: Aleeha Boline, female    DOB: 1971-03-16, 44 y.o.   MRN: [id number]  HPIpt states she has been feeling cold and hot. While at work the other day she didn't feel right and started to have a headache. She checked bp and it was 135/85. Pt rechecked it 3 -4 hours later 102/80. Pt brought in a reading of her blood pressures.  Has been late for work 3 out of 4 days this week because she will just start sweating and her hair will be wet. And then she will be freezing.   This patient states this all started off over the past few weeks. She denies any new changes. She does not feel sick. Is this is just happen intermittently.  Review of Systems Patient does relate sweats early in the morning and a proximally 5 AM 6 AM sometimes when she is going to work she denies high fever chills she denies nausea lack of appetite. Denies wheezing cough weight loss loss of appetite.    Objective:   Physical Exam Lungs are clear hearts regular pulse normal abdomen soft no guarding rebound extremities no edema blood pressure checked twice and is good       Assessment & Plan:  Borderline blood pressure-low-salt diet regular physical activity follow blood pressures closely if worse over the next month to notify us  Intermittent night sweats these occur more in the early morning hours when she first gets up this could be perimenopausal. We will check some lab work. If patient is still having progressive night sweats in the lab work does not prove things out she may also need a chest x-ray and possibly hematology oncology consultation.

## 2014-08-18 NOTE — Patient Instructions (Signed)
DASH Eating Plan °DASH stands for "Dietary Approaches to Stop Hypertension." The DASH eating plan is a healthy eating plan that has been shown to reduce high blood pressure (hypertension). Additional health benefits may include reducing the risk of type 2 diabetes mellitus, heart disease, and stroke. The DASH eating plan may also help with weight loss. °WHAT DO I NEED TO KNOW ABOUT THE DASH EATING PLAN? °For the DASH eating plan, you will follow these general guidelines: °· Choose foods with a percent daily value for sodium of less than 5% (as listed on the food label). °· Use salt-free seasonings or herbs instead of table salt or sea salt. °· Check with your health care provider or pharmacist before using salt substitutes. °· Eat lower-sodium products, often labeled as "lower sodium" or "no salt added." °· Eat fresh foods. °· Eat more vegetables, fruits, and low-fat dairy products. °· Choose whole grains. Look for the word "whole" as the first word in the ingredient list. °· Choose fish and skinless chicken or turkey more often than red meat. Limit fish, poultry, and meat to 6 oz (170 g) each day. °· Limit sweets, desserts, sugars, and sugary drinks. °· Choose heart-healthy fats. °· Limit cheese to 1 oz (28 g) per day. °· Eat more home-cooked food and less restaurant, buffet, and fast food. °· Limit fried foods. °· Cook foods using methods other than frying. °· Limit canned vegetables. If you do use them, rinse them well to decrease the sodium. °· When eating at a restaurant, ask that your food be prepared with less salt, or no salt if possible. °WHAT FOODS CAN I EAT? °Seek help from a dietitian for individual calorie needs. °Grains °Whole grain or whole wheat bread. Brown rice. Whole grain or whole wheat pasta. Quinoa, bulgur, and whole grain cereals. Low-sodium cereals. Corn or whole wheat flour tortillas. Whole grain cornbread. Whole grain crackers. Low-sodium crackers. °Vegetables °Fresh or frozen vegetables  (raw, steamed, roasted, or grilled). Low-sodium or reduced-sodium tomato and vegetable juices. Low-sodium or reduced-sodium tomato sauce and paste. Low-sodium or reduced-sodium canned vegetables.  °Fruits °All fresh, canned (in natural juice), or frozen fruits. °Meat and Other Protein Products °Ground beef (85% or leaner), grass-fed beef, or beef trimmed of fat. Skinless chicken or turkey. Ground chicken or turkey. Pork trimmed of fat. All fish and seafood. Eggs. Dried beans, peas, or lentils. Unsalted nuts and seeds. Unsalted canned beans. °Dairy °Low-fat dairy products, such as skim or 1% milk, 2% or reduced-fat cheeses, low-fat ricotta or cottage cheese, or plain low-fat yogurt. Low-sodium or reduced-sodium cheeses. °Fats and Oils °Tub margarines without trans fats. Light or reduced-fat mayonnaise and salad dressings (reduced sodium). Avocado. Safflower, olive, or canola oils. Natural peanut or almond butter. °Other °Unsalted popcorn and pretzels. °The items listed above may not be a complete list of recommended foods or beverages. Contact your dietitian for more options. °WHAT FOODS ARE NOT RECOMMENDED? °Grains °White bread. White pasta. White rice. Refined cornbread. Bagels and croissants. Crackers that contain trans fat. °Vegetables °Creamed or fried vegetables. Vegetables in a cheese sauce. Regular canned vegetables. Regular canned tomato sauce and paste. Regular tomato and vegetable juices. °Fruits °Dried fruits. Canned fruit in light or heavy syrup. Fruit juice. °Meat and Other Protein Products °Fatty cuts of meat. Ribs, chicken wings, bacon, sausage, bologna, salami, chitterlings, fatback, hot dogs, bratwurst, and packaged luncheon meats. Salted nuts and seeds. Canned beans with salt. °Dairy °Whole or 2% milk, cream, half-and-half, and cream cheese. Whole-fat or sweetened yogurt. Full-fat   cheeses or blue cheese. Nondairy creamers and whipped toppings. Processed cheese, cheese spreads, or cheese  curds. °Condiments °Onion and garlic salt, seasoned salt, table salt, and sea salt. Canned and packaged gravies. Worcestershire sauce. Tartar sauce. Barbecue sauce. Teriyaki sauce. Soy sauce, including reduced sodium. Steak sauce. Fish sauce. Oyster sauce. Cocktail sauce. Horseradish. Ketchup and mustard. Meat flavorings and tenderizers. Bouillon cubes. Hot sauce. Tabasco sauce. Marinades. Taco seasonings. Relishes. °Fats and Oils °Butter, stick margarine, lard, shortening, ghee, and bacon fat. Coconut, palm kernel, or palm oils. Regular salad dressings. °Other °Pickles and olives. Salted popcorn and pretzels. °The items listed above may not be a complete list of foods and beverages to avoid. Contact your dietitian for more information. °WHERE CAN I FIND MORE INFORMATION? °National Heart, Lung, and Blood Institute: www.nhlbi.nih.gov/health/health-topics/topics/dash/ °Document Released: 06/26/2011 Document Revised: 11/21/2013 Document Reviewed: 05/11/2013 °ExitCare® Patient Information ©2015 [employer]. This information is not intended to replace advice given to you by your health care provider. Make sure you discuss any questions you have with your health care provider. ° °

## 2014-08-20 LAB — CBC WITH DIFFERENTIAL/PLATELET
BASOS ABS: 0 10*3/uL (ref 0.0–0.1)
Basophils Relative: 0 % (ref 0–1)
EOS PCT: 2 % (ref 0–5)
Eosinophils Absolute: 0.1 10*3/uL (ref 0.0–0.7)
HCT: 40.5 % (ref 36.0–46.0)
HEMOGLOBIN: 13.4 g/dL (ref 12.0–15.0)
LYMPHS ABS: 1.6 10*3/uL (ref 0.7–4.0)
Lymphocytes Relative: 32 % (ref 12–46)
MCH: 30 pg (ref 26.0–34.0)
MCHC: 33.1 g/dL (ref 30.0–36.0)
MCV: 90.8 fL (ref 78.0–100.0)
MONO ABS: 0.4 10*3/uL (ref 0.1–1.0)
MPV: 9.2 fL (ref 8.6–12.4)
Monocytes Relative: 7 % (ref 3–12)
Neutro Abs: 3 10*3/uL (ref 1.7–7.7)
Neutrophils Relative %: 59 % (ref 43–77)
PLATELETS: 259 10*3/uL (ref 150–400)
RBC: 4.46 MIL/uL (ref 3.87–5.11)
RDW: 12.7 % (ref 11.5–15.5)
WBC: 5 10*3/uL (ref 4.0–10.5)

## 2014-08-20 LAB — BASIC METABOLIC PANEL
BUN: 14 mg/dL (ref 6–23)
CHLORIDE: 103 meq/L (ref 96–112)
CO2: 31 meq/L (ref 19–32)
Calcium: 9.5 mg/dL (ref 8.4–10.5)
Creat: 0.69 mg/dL (ref 0.50–1.10)
GLUCOSE: 84 mg/dL (ref 70–99)
POTASSIUM: 4.5 meq/L (ref 3.5–5.3)
Sodium: 140 mEq/L (ref 135–145)

## 2014-08-20 LAB — LIPID PANEL
CHOLESTEROL: 188 mg/dL (ref 0–200)
HDL: 57 mg/dL (ref >39)
LDL Cholesterol: 112 mg/dL — ABNORMAL HIGH (ref 0–99)
Total CHOL/HDL Ratio: 3.3 Ratio
Triglycerides: 96 mg/dL (ref <150)
VLDL: 19 mg/dL (ref 0–40)

## 2014-08-20 LAB — FOLLICLE STIMULATING HORMONE: FSH: 69.1 m[IU]/mL

## 2014-08-20 LAB — LUTEINIZING HORMONE: LH: 49.5 m[IU]/mL

## 2014-08-20 LAB — TSH: TSH: 1.2 u[IU]/mL (ref 0.350–4.500)

## 2014-11-08 ENCOUNTER — Encounter (INDEPENDENT_AMBULATORY_CARE_PROVIDER_SITE_OTHER): Payer: Self-pay | Admitting: *Deleted

## 2014-12-04 ENCOUNTER — Encounter: Payer: Self-pay | Admitting: Family Medicine

## 2014-12-04 ENCOUNTER — Ambulatory Visit (INDEPENDENT_AMBULATORY_CARE_PROVIDER_SITE_OTHER): Payer: Managed Care, Other (non HMO) | Admitting: Family Medicine

## 2014-12-04 VITALS — BP 130/80 | Temp 98.6°F | Ht 59.0 in | Wt 153.6 lb

## 2014-12-04 DIAGNOSIS — J310 Chronic rhinitis: Secondary | ICD-10-CM

## 2014-12-04 DIAGNOSIS — J349 Unspecified disorder of nose and nasal sinuses: Secondary | ICD-10-CM

## 2014-12-04 DIAGNOSIS — J329 Chronic sinusitis, unspecified: Secondary | ICD-10-CM | POA: Diagnosis not present

## 2014-12-04 MED ORDER — METHYLPREDNISOLONE 4 MG PO TBPK: ORAL_TABLET | ORAL | Status: DC

## 2014-12-04 MED ORDER — LEVOFLOXACIN 500 MG PO TABS: 500.0000 mg | ORAL_TABLET | Freq: Every day | ORAL | Status: DC

## 2014-12-04 MED ORDER — METHYLPREDNISOLONE ACETATE 40 MG/ML IJ SUSP
40.0000 mg | Freq: Once | INTRAMUSCULAR | Status: AC
  Administered 2014-12-04: 40 mg via INTRAMUSCULAR

## 2014-12-04 NOTE — Progress Notes (Signed)
   Subjective:    Patient ID: Ana Gross, female    DOB: 10-09-1970, 44 y.o.   MRN: [id number]  Sinus Problem This is a new problem. The current episode started in the past 7 days. Associated symptoms include congestion, coughing, ear pain, headaches and a sore throat. Treatments tried: saline, allegra and nasacort.   On saline rinse bid and allegra and nasocort  Left cheek pressure and eye watering   Not in chest  Make s pt cough      Review of Systems  HENT: Positive for congestion, ear pain and sore throat.   Respiratory: Positive for cough.   Neurological: Positive for headaches.       Objective:   Physical Exam  Alert moderate malaise vitals stable H&T mom his congestion frontal tenderness pharynx normal neck supple lungs clear heart regular in rhythm.      Assessment & Plan:  Impression acute rhinosinusitis with allergenic component plan steroid injection. Medrol Dosepak. Levaquin daily 10 days. Symptom care discussed. WSL

## 2015-04-23 ENCOUNTER — Encounter (INDEPENDENT_AMBULATORY_CARE_PROVIDER_SITE_OTHER): Payer: Self-pay | Admitting: Internal Medicine

## 2015-04-23 ENCOUNTER — Ambulatory Visit (INDEPENDENT_AMBULATORY_CARE_PROVIDER_SITE_OTHER): Payer: Managed Care, Other (non HMO) | Admitting: Internal Medicine

## 2015-04-23 VITALS — BP 108/70 | HR 80 | Temp 98.5°F | Ht 59.0 in | Wt 152.9 lb

## 2015-04-23 DIAGNOSIS — K219 Gastro-esophageal reflux disease without esophagitis: Secondary | ICD-10-CM

## 2015-04-23 NOTE — Patient Instructions (Addendum)
OV in 2 year.

## 2015-04-23 NOTE — Progress Notes (Signed)
Subjective:    Patient ID: Ana Gross, female    DOB: 06-29-1971, 44 y.o.   MRN: [id number]  HPI Here today for f/u. She was last seen in 2013.   She tells me she is doing good. She rarely has acid reflux.  She tells me it has been almost 2 years since she had a soda. Appetite is good. No weight loss. There is no abdominal pain. She will have abdominal pain if she eats late at night or spicy foods. She has BM usually daily. No melena or BRRB.    12/19/2010 CHY:IFOYD DIAGNOSES:  1. No evidence of erosive esophagitis or peptic ulcer disease.  2. Duodenal and gastric bile reflux which may or may not be  significant finding.   Review of Systems Past Medical History  Diagnosis Date  . GERD (gastroesophageal reflux disease)   . Vitamin D deficiency   . Heart murmur   . Allergy   . Reactive airways dysfunction syndrome   . Headache(784.0) 12/24/2012  . Dizziness and giddiness 12/24/2012  . Obesity     Past Surgical History  Procedure Laterality Date  . Tonsillectomy and adenoidectomy    . Foot surgery Left 2006  . Endometrial ablation      Allergies  Allergen Reactions  . Cefzil [Cefprozil]     Stomach pain  . Iodine     Used in CT scans  . Singulair [Montelukast Sodium]     dizzy    Current Outpatient Prescriptions on File Prior to Visit  Medication Sig Dispense Refill  . Acetaminophen (TYLENOL PO) Take by mouth. As needed    . Aspirin-Acetaminophen-Caffeine (GOODY HEADACHE PO) Take 1 each by mouth as needed.    Marland Kitchen BIOTIN PO Take 1,000 mcg by mouth daily.    . Calcium Carbonate-Vitamin D (CALTRATE 600+D PO) Take by mouth daily. 600 D-3    . carboxymethylcellulose (REFRESH PLUS) 0.5 % SOLN 1 drop 3 (three) times daily as needed.    . Cyanocobalamin (VITAMIN B 12 PO) Take 1,000 mcg by mouth daily.    . Evening Primrose Oil 1000 MG CAPS Take by mouth daily.     . fexofenadine (ALLEGRA) 180 MG tablet Take 180 mg by mouth daily as needed.    . Fish Oil OIL Take by  mouth.    . Probiotic Product (PROBIOTIC PEARLS PO) Take by mouth as needed.     . vitamin E 200 UNIT capsule Take 200 Units by mouth daily.    . fluticasone (FLONASE) 50 MCG/ACT nasal spray Place 2 sprays into the nose daily.    Marland Kitchen levofloxacin (LEVAQUIN) 500 MG tablet Take 1 tablet (500 mg total) by mouth daily. (Patient not taking: Reported on 04/23/2015) 10 tablet 0  . methylPREDNISolone (MEDROL DOSEPAK) 4 MG TBPK tablet Take as directed. (Patient not taking: Reported on 04/23/2015) 21 tablet 0  . omeprazole (PRILOSEC) 20 MG capsule TAKE 1 CAPSULE BY MOUTH AS NEEDED (Patient not taking: Reported on 04/23/2015) 30 capsule 5  . sucralfate (CARAFATE) 1 GM/10ML suspension TAKE 2 TEASPOONSFUL BY MOUTH 4 TIMES DAILY.prn    . vitamin C (ASCORBIC ACID) 500 MG tablet Take 500 mg by mouth daily.     No current facility-administered medications on file prior to visit.  Nasocort daily Tumeric daily.      Objective:   Physical Exam Blood pressure 108/70, pulse 80, temperature 98.5 F (36.9 C), height 4\' 11"  (1.499 m), weight 152 lb 14.4 oz (69.355 kg). Alert and oriented. Skin warm  and dry. Oral mucosa is moist.   . Sclera anicteric, conjunctivae is pink. Thyroid not enlarged. No cervical lymphadenopathy. Lungs clear. Heart regular rate and rhythm.  Abdomen is soft. Bowel sounds are positive. No hepatomegaly. No abdominal masses felt. No tenderness.  No edema to lower extremities.          Assessment & Plan:  GERD. She is doing well. Presently not taking any PPI or Carafate.  OV in 2 years.

## 2015-11-07 ENCOUNTER — Emergency Department (HOSPITAL_COMMUNITY): Payer: Worker's Compensation

## 2015-11-07 ENCOUNTER — Encounter (HOSPITAL_COMMUNITY): Payer: Self-pay | Admitting: Emergency Medicine

## 2015-11-07 ENCOUNTER — Emergency Department (HOSPITAL_COMMUNITY)
Admission: EM | Admit: 2015-11-07 | Discharge: 2015-11-07 | Disposition: A | Discharge status: Home / Self Care | Payer: Worker's Compensation | Attending: Emergency Medicine | Admitting: Emergency Medicine

## 2015-11-07 DIAGNOSIS — Y9389 Activity, other specified: Secondary | ICD-10-CM | POA: Insufficient documentation

## 2015-11-07 DIAGNOSIS — E669 Obesity, unspecified: Secondary | ICD-10-CM | POA: Diagnosis not present

## 2015-11-07 DIAGNOSIS — S6992XA Unspecified injury of left wrist, hand and finger(s), initial encounter: Secondary | ICD-10-CM | POA: Diagnosis present

## 2015-11-07 DIAGNOSIS — Y9289 Other specified places as the place of occurrence of the external cause: Secondary | ICD-10-CM | POA: Insufficient documentation

## 2015-11-07 DIAGNOSIS — Y990 Civilian activity done for income or pay: Secondary | ICD-10-CM | POA: Insufficient documentation

## 2015-11-07 DIAGNOSIS — S60222A Contusion of left hand, initial encounter: Secondary | ICD-10-CM | POA: Diagnosis not present

## 2015-11-07 DIAGNOSIS — W228XXA Striking against or struck by other objects, initial encounter: Secondary | ICD-10-CM | POA: Insufficient documentation

## 2015-11-07 NOTE — ED Notes (Signed)
Pt c/o left hand pain after hitting it at work. The area is bruised and swollen.

## 2015-11-07 NOTE — ED Notes (Signed)
Patient verbalizes understanding of discharge instructions, home care and follow up care if needed. Patient out of department at this time. 

## 2015-11-07 NOTE — Discharge Instructions (Signed)
Hand Contusion  A hand contusion is a deep bruise on your hand area. Contusions are the result of an injury that caused bleeding under the skin. The contusion may turn blue, purple, or yellow. Minor injuries will give you a painless contusion, but more severe contusions may stay painful and swollen for a few weeks.  CAUSES   A contusion is usually caused by a blow, trauma, or direct force to an area of the body.  SYMPTOMS    Swelling and redness of the injured area.   Discoloration of the injured area.   Tenderness and soreness of the injured area.   Pain.  DIAGNOSIS   The diagnosis can be made by taking a history and performing a physical exam. An X-ray, CT scan, or MRI may be needed to determine if there were any associated injuries, such as broken bones (fractures).  TREATMENT   Often, the best treatment for a hand contusion is resting, elevating, icing, and applying cold compresses to the injured area. Over-the-counter medicines may also be recommended for pain control.  HOME CARE INSTRUCTIONS    Put ice on the injured area.    Put ice in a plastic bag.    Place a towel between your skin and the bag.    Leave the ice on for 15-20 minutes, 03-04 times a day.   Only take over-the-counter or prescription medicines as directed by your caregiver. Your caregiver may recommend avoiding anti-inflammatory medicines (aspirin, ibuprofen, and naproxen) for 48 hours because these medicines may increase bruising.   If told, use an elastic wrap as directed. This can help reduce swelling. You may remove the wrap for sleeping, showering, and bathing. If your fingers become numb, cold, or blue, take the wrap off and reapply it more loosely.   Elevate your hand with pillows to reduce swelling.   Avoid overusing your hand if it is painful.  SEEK IMMEDIATE MEDICAL CARE IF:    You have increased redness, swelling, or pain in your hand.   Your swelling or pain is not relieved with medicines.   You have loss of feeling in  your hand or are unable to move your fingers.   Your hand turns cold or blue.   You have pain when you move your fingers.   Your hand becomes warm to the touch.   Your contusion does not improve in 2 days.  MAKE SURE YOU:    Understand these instructions.   Will watch your condition.   Will get help right away if you are not doing well or get worse.     This information is not intended to replace advice given to you by your health care provider. Make sure you discuss any questions you have with your health care provider.     Document Released: 12/27/2001 Document Revised: 03/31/2012 Document Reviewed: 12/29/2011  Elsevier Interactive Patient Education 2016 [employer]

## 2015-11-10 NOTE — ED Provider Notes (Signed)
CSN: [id number]     Arrival date & time 11/07/15  1930 History   First MD Initiated Contact with Patient 11/07/15 1935     Chief Complaint  Patient presents with  . Hand Pain     (Consider location/radiation/quality/duration/timing/severity/associated sxs/prior Treatment) The history is provided by the patient.   Ana Gross is a 45 y.o. female presenting with swelling and bruising to her left hand dorsum. She was at work when she hit her hand against the machine she was running. She had mild pain but continued her job. An hour later she was shocked to look at her hand and notice increased swelling and bruising. She endorses aching pain, denies weakness or numbness in fingertips. She has had no treatment prior to arrival.     Past Medical History  Diagnosis Date  . GERD (gastroesophageal reflux disease)   . Vitamin D deficiency   . Heart murmur   . Allergy   . Reactive airways dysfunction syndrome   . Headache(784.0) 12/24/2012  . Dizziness and giddiness 12/24/2012  . Obesity    Past Surgical History  Procedure Laterality Date  . Tonsillectomy and adenoidectomy    . Foot surgery Left 2006  . Endometrial ablation     Family History  Problem Relation Age of Onset  . COPD Mother   . Diabetes Mother   . Diabetes Father   . Hypertension Father   . Coronary artery disease Father   . COPD Father   . Arthritis Father    Social History  Substance Use Topics  . Smoking status: Never Smoker   . Smokeless tobacco: Never Used  . Alcohol Use: Yes     Comment: she drinks about once or twice a year.   OB History    No data available     Review of Systems  Constitutional: Negative for fever.  Musculoskeletal: Positive for arthralgias. Negative for myalgias and joint swelling.  Skin: Positive for color change.  Neurological: Negative for weakness and numbness.      Allergies  Cefzil; Iodine; and Singulair  Home Medications   Prior to Admission medications    Medication Sig Start Date End Date Taking? Authorizing Provider  Acetaminophen (TYLENOL PO) Take by mouth. As needed    Historical Provider, MD  Aspirin-Acetaminophen-Caffeine (GOODY HEADACHE PO) Take 1 each by mouth as needed.    Historical Provider, MD  BIOTIN PO Take 1,000 mcg by mouth daily.    Historical Provider, MD  Calcium Carbonate-Vitamin D (CALTRATE 600+D PO) Take by mouth daily. 600 D-3    Historical Provider, MD  carboxymethylcellulose (REFRESH PLUS) 0.5 % SOLN 1 drop 3 (three) times daily as needed.    Historical Provider, MD  Cyanocobalamin (VITAMIN B 12 PO) Take 1,000 mcg by mouth daily.    Historical Provider, MD  Evening Primrose Oil 1000 MG CAPS Take by mouth daily.     Historical Provider, MD  fexofenadine (ALLEGRA) 180 MG tablet Take 180 mg by mouth daily as needed.    Historical Provider, MD  Fish Oil OIL Take by mouth.    Historical Provider, MD  fluticasone (FLONASE) 50 MCG/ACT nasal spray Place 2 sprays into the nose daily.    Historical Provider, MD  levofloxacin (LEVAQUIN) 500 MG tablet Take 1 tablet (500 mg total) by mouth daily. Patient not taking: Reported on 04/23/2015 12/04/14   Mikey Kirschner, MD  methylPREDNISolone (MEDROL DOSEPAK) 4 MG TBPK tablet Take as directed. Patient not taking: Reported on 04/23/2015 12/04/14  Mikey Kirschner, MD  omeprazole (PRILOSEC) 20 MG capsule TAKE 1 CAPSULE BY MOUTH AS NEEDED Patient not taking: Reported on 04/23/2015 08/29/13   Rogene Houston, MD  Probiotic Product (PROBIOTIC PEARLS PO) Take by mouth as needed.     Historical Provider, MD  sucralfate (CARAFATE) 1 GM/10ML suspension TAKE 2 TEASPOONSFUL BY MOUTH 4 TIMES DAILY.prn 08/08/13   Rogene Houston, MD  vitamin C (ASCORBIC ACID) 500 MG tablet Take 500 mg by mouth daily.    Historical Provider, MD  vitamin E 200 UNIT capsule Take 200 Units by mouth daily.    Historical Provider, MD   BP 159/98 mmHg  Pulse 73  Temp(Src) 98.5 F (36.9 C) (Oral)  Resp 16  Ht 4\' 11"  (1.499  m)  Wt 69.4 kg  BMI 30.89 kg/m2  SpO2 100% Physical Exam  Constitutional: She appears well-developed and well-nourished.  HENT:  Head: Atraumatic.  Neck: Normal range of motion.  Cardiovascular:  Pulses equal bilaterally  Musculoskeletal: She exhibits edema and tenderness.  Ttp, edema, bruising to left dorsal hand. Distal sensation intact.  Less than 2 sec fingertip cap refill. Pt can flex/ext wrist without pain. Can make nearly full fist with discomfort. No palpable deformity.  Neurological: She is alert. She has normal strength. She displays normal reflexes. No sensory deficit.  Skin: Skin is warm and dry.  Psychiatric: She has a normal mood and affect.    ED Course  Procedures (including critical care time) Labs Review Labs Reviewed - No data to display  Imaging Review    Dg Hand Complete Left  11/07/2015  CLINICAL DATA:  Hit hand on screw machine at work. Left hand pain and bruising. Initial encounter. EXAM: LEFT HAND - COMPLETE 3+ VIEW COMPARISON:  None. FINDINGS: There is no evidence of fracture or dislocation. There is no evidence of arthropathy or other focal bone abnormality. Soft tissues are unremarkable. IMPRESSION: Negative. Electronically Signed   By: Earle Gell M.D.   On: 11/07/2015 20:22     I have personally reviewed and evaluated these images and lab results as part of my medical decision-making.   EKG Interpretation None      MDM   Final diagnoses:  Hand contusion, left, initial encounter    Watson jones splint, RICE, ibuprofen. F/u with pcp if sx not improved over the next 7-10 days.    [name], PA-C 11/10/15 2021  [name], MD 11/12/15 1318

## 2016-04-15 ENCOUNTER — Encounter: Payer: Self-pay | Admitting: Family Medicine

## 2016-04-15 ENCOUNTER — Ambulatory Visit (INDEPENDENT_AMBULATORY_CARE_PROVIDER_SITE_OTHER): Payer: 59 | Admitting: Family Medicine

## 2016-04-15 VITALS — BP 100/70 | Temp 98.4°F | Ht 59.0 in | Wt 156.0 lb

## 2016-04-15 DIAGNOSIS — M5412 Radiculopathy, cervical region: Secondary | ICD-10-CM | POA: Diagnosis not present

## 2016-04-15 MED ORDER — PREDNISONE 20 MG PO TABS: ORAL_TABLET | ORAL | 0 refills | Status: DC

## 2016-04-15 MED ORDER — HYDROCODONE-ACETAMINOPHEN 5-325 MG PO TABS: 1.0000 | ORAL_TABLET | Freq: Four times a day (QID) | ORAL | 0 refills | Status: DC | PRN

## 2016-04-15 NOTE — Progress Notes (Signed)
   Subjective:    Patient ID: Ana Gross, female    DOB: July 19, 1971, 45 y.o.   MRN: [id number]  Neck Pain   This is a new problem. The problem occurs intermittently. The problem has been unchanged. The pain is associated with nothing. The quality of the pain is described as aching. The pain is moderate. Nothing aggravates the symptoms. Stiffness is present all day. Associated symptoms comments: Right shoulder and hand pain. She has tried NSAIDs and ice for the symptoms. The treatment provided no relief.   Patient has no other concerns at this time.    Review of Systems  Musculoskeletal: Positive for neck pain.  Patient relates some symptoms into the right shoulder area in the trapezius region she also relates some symptoms in the form and soreness in the MCP joints of 3 of her fingers. She relates aching pain and discomfort off and on. She has had some neck discomfort off and on for a couple months but the arm symptoms and hand symptoms of been only over the past few days     Objective:   Physical Exam She does not appear to be in toxic neck has some tenderness along the lateral spinal muscles on the right side and into the trapezius reflexes are good strength is good subjective discomfort in the forearm and in her hand no swelling noted no rash.       Assessment & Plan:  Cervical impingement prednisone taper anti-inflammatory when necessary, pain medication as necessary, follow-up if progressive troubles pain medicine only for home use  Patient was instructed to follow-up with Dr. Richardson Landry within 10-14 days for recheck may end up needing x-rays possible EMG nerve conduction studies do not feel patient needs MRI currently

## 2016-04-15 NOTE — Progress Notes (Signed)
   Subjective:    Patient ID: Ana Gross, female    DOB: 17-Dec-1970, 45 y.o.   MRN: [id number]  HPI    Review of Systems     Objective:   Physical Exam        Assessment & Plan:

## 2016-05-02 ENCOUNTER — Encounter: Payer: Self-pay | Admitting: Family Medicine

## 2016-05-02 ENCOUNTER — Ambulatory Visit (HOSPITAL_COMMUNITY)
Admission: RE | Admit: 2016-05-02 | Discharge: 2016-05-02 | Disposition: A | Discharge status: Home / Self Care | Payer: 59 | Source: Ambulatory Visit | Attending: Family Medicine | Admitting: Family Medicine

## 2016-05-02 ENCOUNTER — Ambulatory Visit (INDEPENDENT_AMBULATORY_CARE_PROVIDER_SITE_OTHER): Payer: 59 | Admitting: Family Medicine

## 2016-05-02 VITALS — BP 140/82 | Ht 59.0 in | Wt 157.0 lb

## 2016-05-02 DIAGNOSIS — M5412 Radiculopathy, cervical region: Secondary | ICD-10-CM | POA: Insufficient documentation

## 2016-05-02 DIAGNOSIS — M50322 Other cervical disc degeneration at C5-C6 level: Secondary | ICD-10-CM | POA: Diagnosis not present

## 2016-05-02 NOTE — Progress Notes (Signed)
   Subjective:    Patient ID: Ana Gross, female    DOB: 19-Jul-1971, 45 y.o.   MRN: [id number]  HPI  Patient in today for a recheck on cervical neuropathic pain. Last seen for this problem on 04/15/16.  Tingling and pain and burnign extends all the way into the right hand  Bad burning in the shoulder  Discomfort also substantial in the right lat neck   Pt has seen chiro and local measures but not much help  Pos fam hx of arthritis, and some spinal issues  Started swith arthritis in the mid fifties  Neck painfl now for six months, also wkes her up at night   Patient also has concerns of wart to right arm, and cyst to neck.  Right arm wart for a yr  And notes cyst under the skin on meck  Pain in neck severe at times deep. Burning in nature. Radiates from the neck into the shoulder THE hand. A deep toothache E discomfort. Associated with numbness and tingling and a Molnar distribution in the right hand. No obvious weakness     Review of Systems No headache, no major weight loss or weight gain, no chest pain no back pain abdominal pain no change in bowel habits complete ROS otherwise negative     Objective:   Physical Exam  Alert vitals stable, NAD. Blood pressure good on repeat. HEENT normal. Lungs clear. Heart regular rate and rhythm. Right hand strength sensation grip intact shoulder good range of motion no obvious pain neck positive lateral pain with radiation to the right. Subcutaneous cyst under skin of anterior neck. Right elbow wart noted      Assessment & Plan:  Impression cervical neuropathy progressive right arm unresponsive to prior interventions. Severe at times. Near disabling and discomfort and symptomatology stain for neck although into the hand. When the hand is at its worse it feels weak also. #2 wart elbow #3 subcutaneous cyst plan patient to call dermatologists on her own. Cervical x-ray. And cervical MRI

## 2016-05-09 ENCOUNTER — Ambulatory Visit (HOSPITAL_COMMUNITY)
Admission: RE | Admit: 2016-05-09 | Discharge: 2016-05-09 | Disposition: A | Discharge status: Home / Self Care | Payer: 59 | Source: Ambulatory Visit | Attending: Family Medicine | Admitting: Family Medicine

## 2016-05-09 DIAGNOSIS — M5412 Radiculopathy, cervical region: Secondary | ICD-10-CM | POA: Diagnosis present

## 2016-05-09 DIAGNOSIS — M50122 Cervical disc disorder at C5-C6 level with radiculopathy: Secondary | ICD-10-CM | POA: Diagnosis not present

## 2016-05-12 ENCOUNTER — Other Ambulatory Visit: Payer: Self-pay

## 2016-05-12 DIAGNOSIS — M542 Cervicalgia: Secondary | ICD-10-CM

## 2016-05-16 ENCOUNTER — Ambulatory Visit (INDEPENDENT_AMBULATORY_CARE_PROVIDER_SITE_OTHER): Payer: 59 | Admitting: Nurse Practitioner

## 2016-05-16 ENCOUNTER — Encounter: Payer: Self-pay | Admitting: Nurse Practitioner

## 2016-05-16 VITALS — BP 120/80 | Temp 98.5°F | Ht 59.0 in | Wt 155.5 lb

## 2016-05-16 DIAGNOSIS — J329 Chronic sinusitis, unspecified: Secondary | ICD-10-CM

## 2016-05-16 DIAGNOSIS — R062 Wheezing: Secondary | ICD-10-CM | POA: Diagnosis not present

## 2016-05-16 MED ORDER — ALBUTEROL SULFATE HFA 108 (90 BASE) MCG/ACT IN AERS: 2.0000 | INHALATION_SPRAY | RESPIRATORY_TRACT | 0 refills | Status: DC | PRN

## 2016-05-16 MED ORDER — METHYLPREDNISOLONE ACETATE 40 MG/ML IJ SUSP
40.0000 mg | Freq: Once | INTRAMUSCULAR | Status: AC
  Administered 2016-05-16: 40 mg via INTRAMUSCULAR

## 2016-05-16 MED ORDER — AMOXICILLIN-POT CLAVULANATE 875-125 MG PO TABS: 1.0000 | ORAL_TABLET | Freq: Two times a day (BID) | ORAL | 0 refills | Status: DC

## 2016-05-16 MED ORDER — PREDNISONE 20 MG PO TABS: ORAL_TABLET | ORAL | 0 refills | Status: DC

## 2016-05-17 ENCOUNTER — Encounter: Payer: Self-pay | Admitting: Nurse Practitioner

## 2016-05-17 NOTE — Progress Notes (Signed)
Subjective:  Presents for c/o sinus congestion x 5 d. No fever. Sore throat. Facial area headache. Yellow nasal drainage. Cough began last night. Slight wheezing last night. Relieved with albuterol inhaler. Not used today. Bilateral ear pressure.   Objective:   BP 120/80   Temp 98.5 F (36.9 C) (Oral)   Ht 4\' 11"  (1.499 m)   Wt 155 lb 8 oz (70.5 kg)   BMI 31.41 kg/m  NAD. Alert, oriented. TMs clear effusion. Pharynx injected with PND noted. Obvious head congestion. Neck supple with mild anterior adenopathy. Lungs occasional expiratory wheeze; otherwise clear. No indication for neb treatment. No tachypnea.   Assessment: Rhinosinusitis - Plan: methylPREDNISolone acetate (DEPO-MEDROL) injection 40 mg  Wheezing - Plan: methylPREDNISolone acetate (DEPO-MEDROL) injection 40 mg  Plan:  Meds ordered this encounter  Medications  . predniSONE (DELTASONE) 20 MG tablet    Sig: 3 po qd x 3 d then 2 po qd x 3 d then 1 po qd x 2 d    Dispense:  17 tablet    Refill:  0    Order Specific Question:   Supervising Provider    Answer:   Mikey Kirschner [[id number]]  . albuterol (PROVENTIL HFA;VENTOLIN HFA) 108 (90 Base) MCG/ACT inhaler    Sig: Inhale 2 puffs into the lungs every 4 (four) hours as needed.    Dispense:  1 Inhaler    Refill:  0    Order Specific Question:   Supervising Provider    Answer:   Mikey Kirschner [[id number]]  . amoxicillin-clavulanate (AUGMENTIN) 875-125 MG tablet    Sig: Take 1 tablet by mouth 2 (two) times daily.    Dispense:  20 tablet    Refill:  0    Order Specific Question:   Supervising Provider    Answer:   Mikey Kirschner [[id number]]  . methylPREDNISolone acetate (DEPO-MEDROL) injection 40 mg   OTC meds as directed for congestion and cough. Call back in 72 hours if no improvement, sooner if worse.

## 2016-06-05 ENCOUNTER — Ambulatory Visit (INDEPENDENT_AMBULATORY_CARE_PROVIDER_SITE_OTHER): Payer: 59 | Admitting: Neurology

## 2016-06-05 ENCOUNTER — Encounter: Payer: Self-pay | Admitting: Neurology

## 2016-06-05 VITALS — BP 128/87 | HR 67 | Ht 59.0 in | Wt 157.5 lb

## 2016-06-05 DIAGNOSIS — R9089 Other abnormal findings on diagnostic imaging of central nervous system: Secondary | ICD-10-CM | POA: Diagnosis not present

## 2016-06-05 DIAGNOSIS — M542 Cervicalgia: Secondary | ICD-10-CM | POA: Diagnosis not present

## 2016-06-05 DIAGNOSIS — S161XXA Strain of muscle, fascia and tendon at neck level, initial encounter: Secondary | ICD-10-CM | POA: Insufficient documentation

## 2016-06-05 DIAGNOSIS — S161XXD Strain of muscle, fascia and tendon at neck level, subsequent encounter: Secondary | ICD-10-CM

## 2016-06-05 MED ORDER — MELOXICAM 15 MG PO TABS: 15.0000 mg | ORAL_TABLET | Freq: Every day | ORAL | 1 refills | Status: DC

## 2016-06-05 MED ORDER — GABAPENTIN 100 MG PO CAPS: 100.0000 mg | ORAL_CAPSULE | Freq: Three times a day (TID) | ORAL | 2 refills | Status: DC

## 2016-06-05 NOTE — Progress Notes (Signed)
Reason for visit: Neck discomfort  Referring physician: Dr. Jearld Lesch is a 45 y.o. female  History of present illness:  Ms. Caudill is a 45 year old right-handed white female with a history of headaches and neck pain. The patient was seen here over 3 years ago for headaches, MRI of the brain was done and showed evidence of multiple white matter lesions, MRI of the cervical spine was done at that time and appeared to be unremarkable, no spinal cord lesions were seen. A visual evoked response test was done and was normal. The patient was lost to follow-up. Over time, the patient indicates that her headaches have become much less frequent, she may have one headache every 2 or 3 months. More recently, over the last 6-8 months she has had some right-sided neck discomfort going down into the upper shoulder. When the neck pain is severe, she may have some tingling sensations in the fourth and fifth fingers on the right hand. She denies true weakness of extremities. The patient does report some occasional blurred vision, she denies any balance problems or difficulty controlling the bowels or the bladder. She denies memory problems, dizziness, or confusion. The patient has undergone MRI of the cervical spine that shows a mild disc bulge at C5-6 level, no evidence of spinal cord compression or nerve root compression. The patient is sent to this office for an evaluation.  Past Medical History:  Diagnosis Date  . Allergy   . Dizziness and giddiness 12/24/2012  . GERD (gastroesophageal reflux disease)   . Headache(784.0) 12/24/2012  . Heart murmur   . Obesity   . Reactive airways dysfunction syndrome   . Vitamin D deficiency     Past Surgical History:  Procedure Laterality Date  . ENDOMETRIAL ABLATION    . FOOT SURGERY Left 2006  . TONSILLECTOMY AND ADENOIDECTOMY      Family History  Problem Relation Age of Onset  . COPD Mother   . Diabetes Mother   . Diabetes Father   .  Hypertension Father   . Coronary artery disease Father   . COPD Father   . Arthritis Father     Social history:  reports that she has never smoked. She has never used smokeless tobacco. She reports that she drinks alcohol. She reports that she does not use drugs.  Medications:  Prior to Admission medications   Medication Sig Start Date End Date Taking? Authorizing Provider  Acetaminophen (TYLENOL PO) Take by mouth. As needed   Yes Historical Provider, MD  albuterol (PROVENTIL HFA;VENTOLIN HFA) 108 (90 Base) MCG/ACT inhaler Inhale 2 puffs into the lungs every 4 (four) hours as needed. 05/16/16  Yes Nilda Simmer, NP  Aspirin-Acetaminophen-Caffeine (GOODY HEADACHE PO) Take 1 each by mouth as needed.   Yes Historical Provider, MD  BIOTIN PO Take 1,000 mcg by mouth daily.   Yes Historical Provider, MD  Calcium Carbonate-Vitamin D (CALTRATE 600+D PO) Take by mouth daily. 600 D-3   Yes Historical Provider, MD  carboxymethylcellulose (REFRESH PLUS) 0.5 % SOLN 1 drop 3 (three) times daily as needed.   Yes Historical Provider, MD  Cyanocobalamin (VITAMIN B 12 PO) Take 1,000 mcg by mouth daily.   Yes Historical Provider, MD  Evening Primrose Oil 1000 MG CAPS Take by mouth daily.    Yes Historical Provider, MD  fexofenadine (ALLEGRA) 180 MG tablet Take 180 mg by mouth daily as needed.   Yes Historical Provider, MD  Fish Oil OIL Take by mouth.  Yes Historical Provider, MD  Probiotic Product (PROBIOTIC PEARLS PO) Take by mouth daily.    Yes Historical Provider, MD  sucralfate (CARAFATE) 1 GM/10ML suspension TAKE 2 TEASPOONSFUL BY MOUTH 4 TIMES DAILY.prn 08/08/13  Yes Rogene Houston, MD  Triamcinolone Acetonide (NASACORT ALLERGY 24HR NA) Place into the nose daily.   Yes Historical Provider, MD  TURMERIC PO Take by mouth.   Yes Historical Provider, MD  UNABLE TO FIND Med Name: Isagenix shakes and greens   Yes Historical Provider, MD  vitamin C (ASCORBIC ACID) 500 MG tablet Take 500 mg by mouth daily.    Yes Historical Provider, MD  vitamin E 200 UNIT capsule Take 200 Units by mouth daily.   Yes Historical Provider, MD      Allergies  Allergen Reactions  . Cefzil [Cefprozil]     Stomach pain  . Iodine     Used in CT scans  . Singulair [Montelukast Sodium]     dizzy    ROS:  Out of a complete 14 system review of symptoms, the patient complains only of the following symptoms, and all other reviewed systems are negative.  Cough Heart murmur Snoring Environmental allergies Joint pain, joint swelling, neck pain Numbness, weakness  Blood pressure 128/87, pulse 67, height 4\' 11"  (1.499 m), weight 157 lb 8 oz (71.4 kg).  Physical Exam  General: The patient is alert and cooperative at the time of the examination.  Eyes: Pupils are equal, round, and reactive to light. Discs are flat bilaterally.  Neck: The neck is supple, no carotid bruits are noted.  Respiratory: The respiratory examination is clear.  Cardiovascular: The cardiovascular examination reveals a regular rate and rhythm, no obvious murmurs or rubs are noted.  Neuromuscular: The patient lacks about 10 of full lateral rotation of the cervical spine bilaterally.  Skin: Extremities are without significant edema.  Neurologic Exam  Mental status: The patient is alert and oriented x 3 at the time of the examination. The patient has apparent normal recent and remote memory, with an apparently normal attention span and concentration ability.  Cranial nerves: Facial symmetry is present. There is good sensation of the face to pinprick and soft touch bilaterally. The strength of the facial muscles and the muscles to head turning and shoulder shrug are normal bilaterally. Speech is well enunciated, no aphasia or dysarthria is noted. Extraocular movements are full. Visual fields are full. The tongue is midline, and the patient has symmetric elevation of the soft palate. No obvious hearing deficits are noted.  Motor: The motor  testing reveals 5 over 5 strength of all 4 extremities. Good symmetric motor tone is noted throughout.  Sensory: Sensory testing is intact to pinprick, soft touch, vibration sensation, and position sense on all 4 extremities. No evidence of extinction is noted.  Coordination: Cerebellar testing reveals good finger-nose-finger and heel-to-shin bilaterally.  Gait and station: Gait is normal. Tandem gait is normal. Romberg is negative. No drift is seen.  Reflexes: Deep tendon reflexes are symmetric and normal bilaterally. Toes are downgoing bilaterally.   Assessment/Plan:  1. Cervical strain, chronic  2. Abnormal MRI brain  The patient will have a repeat MRI of the brain with and without gadolinium enhancement. This will be compared to the prior MRI evaluation. The patient will be placed on gabapentin, and Mobic for the chronic neck pain. She will be set up for neuromuscular therapy. She will follow-up in about 3 months. The patient is working, her job requires relatively frequent lifting up  to 35 pounds, this may be exacerbating her current symptoms.  Jill Alexanders MD 06/05/2016 10:28 AM  [facility] [address] [place], [place] [address]  Phone [phone] Fax [phone]

## 2016-06-05 NOTE — Patient Instructions (Addendum)
   With the gabapentin 100 mg capsules, start 1 capsule three times a day for 2 weeks, then take 2 capsules three times a day.   Neurontin (gabapentin) may result in drowsiness, ankle swelling, gait instability, or possibly dizziness. Please contact our office if significant side effects occur with this medication.

## 2016-06-29 ENCOUNTER — Ambulatory Visit
Admission: RE | Admit: 2016-06-29 | Discharge: 2016-06-29 | Disposition: A | Discharge status: Home / Self Care | Payer: 59 | Source: Ambulatory Visit | Attending: Neurology | Admitting: Neurology

## 2016-06-29 DIAGNOSIS — R9089 Other abnormal findings on diagnostic imaging of central nervous system: Secondary | ICD-10-CM | POA: Diagnosis not present

## 2016-06-29 MED ORDER — GADOBENATE DIMEGLUMINE 529 MG/ML IV SOLN
14.0000 mL | Freq: Once | INTRAVENOUS | Status: AC | PRN
  Administered 2016-06-29: 14 mL via INTRAVENOUS

## 2016-07-01 ENCOUNTER — Telehealth: Payer: Self-pay | Admitting: Neurology

## 2016-07-01 DIAGNOSIS — R9089 Other abnormal findings on diagnostic imaging of central nervous system: Secondary | ICD-10-CM

## 2016-07-01 NOTE — Telephone Encounter (Signed)
I called patient. The MRI the brain has shown some increase in white matter lesions. We will get a transcranial Doppler bubble study, if this is unremarkable, will consider lumbar puncture.   MRI brain 07/01/16:  IMPRESSION:  This MRI of the brain with and without contrast shows the following: 1.    There are multiple T2/FLAIR hyperintense foci, predominantly in the subcortical white matter of both hemispheres.    None of the foci enhances or appears on diffusion-weighted images. They're to the 01/12/2013 MRI, there appears to be several more subcortical foci.   This is a nonspecific finding and could represent chronic microvascular ischemic change or be the sequela of migraine or cardio-emboli.  If clinically indicated, consider evaluation for an intracardiac shunt.   The appearance is less typical for demyelination. 2.    There are no acute findings.

## 2016-07-02 ENCOUNTER — Telehealth: Payer: Self-pay | Admitting: Neurology

## 2016-07-02 NOTE — Telephone Encounter (Signed)
Called patient to schedule Doppler . No Pa needed. Left patient a voice mail.

## 2016-07-02 NOTE — Telephone Encounter (Signed)
Spoke to patient and she is scheduled for TCD 07/31/2015.

## 2016-07-04 ENCOUNTER — Ambulatory Visit: Payer: 59 | Attending: Neurology | Admitting: Rehabilitative and Restorative Service Providers"

## 2016-07-04 DIAGNOSIS — M542 Cervicalgia: Secondary | ICD-10-CM | POA: Insufficient documentation

## 2016-07-04 DIAGNOSIS — R293 Abnormal posture: Secondary | ICD-10-CM | POA: Insufficient documentation

## 2016-07-04 NOTE — Patient Instructions (Signed)
Scapula Adduction With Pectoralis Stretch: Mid-Range - Standing   Shoulders at 90 elbows even with shoulders, keeping weight through legs, shift weight forward until you feel pull or strength through the front of your chest. Hold __30_ seconds. Do _3__ times, __2-4_ times per day.   Thoracic Self-Mobilization (Supine)    With rolled towel placed lengthwise at lower ribs level, lie back on towel with arms outstretched. Hold _2 minutes. Relax. Repeat __1-2__ times per day as needed for postural stretching.  http://orth.exer.us/1001   Copyright  VHI. All rights reserved.   Healthy Back - Shoulder Roll    Stand straight with arms relaxed at sides. Roll shoulders backward continuously. Do __10__ times.  Can repeat throughout the day, as tolerated.  Copyright  VHI. All rights reserved.   Extensors, Supine    Lie supine, head on small, rolled towel or one pillow.  Gently tuck chin and bring toward chest. Hold __5_ seconds. Repeat __10_ times per session. Do _2__ sessions per day.  Copyright  VHI. All rights reserved.   Scapular Retraction (Prone)    Lie with arms at sides. Pinch shoulder blades together and raise arms a few inches from floor. Repeat _10___ times per set. Do __2__ sets per session. Do ___2_ sessions per day.  http://orth.exer.us/955   Copyright  VHI. All rights reserved.

## 2016-07-04 NOTE — Therapy (Signed)
[facility] [address] [place], [place], [address] Phone: [phone]   Fax:  [phone]  Physical Therapy Evaluation  Patient Details  Name: Ana Gross MRN: [id number] Date of Birth: 19-Jul-1971 Referring Provider: Lenor Coffin, MD  Encounter Date: 07/04/2016      PT End of Session - 07/04/16 1015    Visit Number 1   Number of Visits 8   Date for PT Re-Evaluation 08/18/16   Authorization Type private insurance   PT Start Time [phone]   PT Stop Time 1012   PT Time Calculation (min) 44 min   Activity Tolerance Patient tolerated treatment well   Behavior During Therapy [facility] for tasks assessed/performed      Past Medical History:  Diagnosis Date  . Allergy   . Dizziness and giddiness 12/24/2012  . GERD (gastroesophageal reflux disease)   . Headache(784.0) 12/24/2012  . Heart murmur   . Obesity   . Reactive airways dysfunction syndrome   . Vitamin D deficiency     Past Surgical History:  Procedure Laterality Date  . ENDOMETRIAL ABLATION    . FOOT SURGERY Left 2006  . TONSILLECTOMY AND ADENOIDECTOMY      There were no vitals filed for this visit.       Subjective Assessment - 07/04/16 0929    Subjective The patient reports onset of discomfort in neck approximately 8 months ago "like I had a crick in  my neck and had slept wrong."  She went to the chiropractor and had an adjustment and then did stretching.  It would intermittently improve and worsen.  One day at work she noted a burning sensation down arm into 2 fingers -- she got referred to neurology and completed MRI.  Referred today for neuromuscular therapy for neck.    Patient Stated Goals reduce pain   Currently in Pain? Yes   Pain Score 3   "uncomfortable" now.    Pain Location Neck   Pain Orientation Right   Pain Descriptors / Indicators Burning;Tightness   Pain Type Chronic pain   Pain Radiating Towards Radiates into R shoulder and  notes parascapular symptoms   Pain Onset More than a month ago   Pain Frequency Intermittent   Aggravating Factors  lifting overhead (some days it bothers me and some days it doesn't)   Pain Relieving Factors stretching; ice at times;             [facility] PT Assessment - 07/04/16 0934      Assessment   Medical Diagnosis neck pain, neuromuscular therapy   Referring Provider Lenor Coffin, MD   Onset Date/Surgical Date --  approximately 8 months ago   Hand Dominance Right   Prior Therapy chiropractor, no PT at this time.     Precautions   Precautions None     Restrictions   Weight Bearing Restrictions No     Balance Screen   Has the patient fallen in the past 6 months No   Has the patient had a decrease in activity level because of a fear of falling?  No   Is the patient reluctant to leave their home because of a fear of falling?  No     Home [employer] residence     Prior Function   Level of Independence Independent   Vocation Full time employment   Vocation Requirements Repetitive movements in standing, requiring some lifting activities     Observation/Other Assessments  Focus on Therapeutic Outcomes (FOTO)  59%   Neck Disability Index  26%     Sensation   Additional Comments can develop numbness and tingling in parascapular region and anterior shouler/chest area     ROM / Strength   AROM / PROM / Strength AROM;Strength     AROM   Overall AROM Comments shoulder AROM WFLs.   AROM Assessment Site Cervical;Shoulder   Right/Left Shoulder --   Cervical Flexion 32   Cervical Extension 16  notes discomfort   Cervical - Right Side Bend 30  "I can feel it pinching" with pain R upper trap   Cervical - Left Side Bend 34   Cervical - Right Rotation 52   Cervical - Left Rotation 58     Strength   Overall Strength Comments bilateral shoulder flexion 5/5, shoulder abduction 5/5, elbow flexion 5/5     Palpation   Palpation comment  Significant tenderness and tightness noted over R > L scalenes, R upper trapezius, anterior chest musculature, R subscapularis and R rhomboids.       Special Tests    Special Tests Cervical   Cervical Tests Dictraction     Distraction Test   Comment distraction reduces symptoms.                   [facility] Adult PT Treatment/Exercise - 07/04/16 1014      Exercises   Exercises Neck     Neck Exercises: Standing   Other Standing Exercises shoulder rolls and doorway stretch for chest     Neck Exercises: Supine   Neck Retraction 10 reps;5 secs     Neck Exercises: Prone   Other Prone Exercise scapular retraction x 10 reps; prone on elbows                 PT Education - 07/04/16 1007    Education provided Yes   Education Details HEP:  shoulder circles, neck retraction, scapular prone retraction, towel roll stretch, door frame chest stretch   Person(s) Educated Patient   Methods Explanation;Demonstration;Handout   Comprehension Verbalized understanding;Returned demonstration          PT Short Term Goals - 07/04/16 1016      PT SHORT TERM GOAL #1   Title The patient will be indep with HEP for scapular stabilization, neck stabilization, stretching.   Baseline Target date 08/04/2016   Time 4   Period Weeks     PT SHORT TERM GOAL #2   Title The patient will improve neck AROM rotation to > or equal to 65 degrees bilaterally.   Baseline Target date 08/04/2016   Time 4   Period Weeks     PT SHORT TERM GOAL #3   Title The patient will report pain at rest < or equal to 1/10 to demo improving neck discomfort.   Baseline Target date 08/04/2016   Time 4   Period Weeks     PT SHORT TERM GOAL #4   Title The patient will return demo sleeping positions for home to gain greater comfort at night.   Baseline Target date 08/04/2016   Time 4   Period Weeks           PT Long Term Goals - 07/04/16 1017      PT LONG TERM GOAL #1   Title The patient will reduce NDI  from 26% to < or equal to 12% to demo dec'd self perception of neck disability.   Baseline Target date  08/18/2016  Time 6   Period Weeks     PT LONG TERM GOAL #2   Title The patient will return demo correct body mechanics during work simulation tasks.   Baseline Target date 08/18/2016   Time 6   Period Weeks     PT LONG TERM GOAL #3   Title The patient will be indep with progression of HEP for post d/c strengthening/flexibility.   Baseline Target date  08/18/2016   Time 6   Period Weeks               Plan - 07/04/16 1218    Clinical Impression Statement The patient is a 45 year old female presenting to OP rehab with chronic neck pain radiating into chest/parascapular region when severe.  She presents with significant muscle tightness and shortening in R scalenes and upper trap.  She also has chest tightness leading to postural rounding and increased thoracic kyphosis.  PT to address limitations to improve ability to perform job tasks and household tasks without pain.    Rehab Potential Good   PT Frequency 2x / week   PT Duration 2 weeks  then 1x/week for 4 weeks   PT Treatment/Interventions ADLs/Self Care Home Management;Cryotherapy;Therapeutic activities;Therapeutic exercise;Neuromuscular re-education;Patient/family education;Passive range of motion;Manual techniques;Moist Heat   PT Next Visit Plan Check HEP, scapular strengthening/ chest stretching, postural training, sleep positions, spinal stabilization; soft tissue mobilization neck and parascapular region R   Consulted and Agree with Plan of Care Patient      Patient will benefit from skilled therapeutic intervention in order to improve the following deficits and impairments:  Pain, Decreased range of motion, Increased muscle spasms, Decreased strength, Postural dysfunction, Impaired flexibility, Improper body mechanics  Visit Diagnosis: Neck pain  Abnormal posture     Problem List Patient Active Problem List    Diagnosis Date Noted  . Cervical strain 06/05/2016  . Headache(784.0) 12/24/2012  . Dizziness and giddiness 12/24/2012  . Back pain 10/14/2012  . GERD (gastroesophageal reflux disease) 08/12/2011  . Vitamin D deficiency   . Heart murmur     Bert Ptacek, PT 07/04/2016, 12:22 PM  [facility] [address] [place], [place], [address] Phone: [phone]   Fax:  [phone]  Name: Quayla Lubitz MRN: [id number] Date of Birth: 25-Feb-1971

## 2016-07-07 ENCOUNTER — Encounter: Payer: Self-pay | Admitting: Physical Therapy

## 2016-07-07 ENCOUNTER — Ambulatory Visit: Payer: 59 | Admitting: Physical Therapy

## 2016-07-07 DIAGNOSIS — M542 Cervicalgia: Secondary | ICD-10-CM | POA: Diagnosis not present

## 2016-07-07 DIAGNOSIS — R293 Abnormal posture: Secondary | ICD-10-CM

## 2016-07-07 NOTE — Therapy (Signed)
[facility] [address] [place], [place], [address] Phone: [phone]   Fax:  [phone]  Physical Therapy Treatment  Patient Details  Name: Chrystian Carrothers MRN: [id number] Date of Birth: 21-Jun-1971 Referring Provider: Lenor Coffin, MD  Encounter Date: 07/07/2016      PT End of Session - 07/07/16 1408    Visit Number 2   Number of Visits 8   Date for PT Re-Evaluation 08/18/16   Authorization Type private insurance   PT Start Time [id number]   PT Stop Time 1443   PT Time Calculation (min) 40 min   Activity Tolerance Patient tolerated treatment well   Behavior During Therapy [facility] for tasks assessed/performed      Past Medical History:  Diagnosis Date  . Allergy   . Dizziness and giddiness 12/24/2012  . GERD (gastroesophageal reflux disease)   . Headache(784.0) 12/24/2012  . Heart murmur   . Obesity   . Reactive airways dysfunction syndrome   . Vitamin D deficiency     Past Surgical History:  Procedure Laterality Date  . ENDOMETRIAL ABLATION    . FOOT SURGERY Left 2006  . TONSILLECTOMY AND ADENOIDECTOMY      There were no vitals filed for this visit.      Subjective Assessment - 07/07/16 1407    Subjective No new complaints. Doing the stretches at home without issues, feels it more on left than right side.   Patient Stated Goals reduce pain   Currently in Pain? Yes   Pain Score 3    Pain Location Neck   Pain Descriptors / Indicators Nagging   Pain Type Chronic pain   Pain Onset More than a month ago   Pain Frequency Intermittent   Aggravating Factors  lifting overhead   Pain Relieving Factors stretching, ice at times, "[place]" gel              [facility] Adult PT Treatment/Exercise - 07/07/16 1409      Neck Exercises: Seated   X to V 10 reps;Limitations   X to V Limitations cues on form and technique   W Back Limitations x 10 reps with scapular retractions, cues on posture and ex form   Shoulder Rolls Backwards;15 reps   Shoulder Rolls Limitations cues to slow down and for smaller motions     Neck Exercises: Supine   Neck Retraction 10 reps;5 secs   Neck Retraction Limitations in hooklying after manual therapy with manual cues on correct form and technique   Other Supine Exercise hooklying with towel roll along spine: arms out in "T" pattern for chest/pec stretch x 1-2 mintue holds; towel along spine concurrent with exercises: 2cd set of chin tucks with 5 sec holds x 10 reps, scapular retractions with arms at sides along mat 3 sec holds x 10 reps, scapular depressions (punching towards feet) x 10 reps, all with cues on ex form and technique.                           Manual Therapy   Manual Therapy Soft tissue mobilization;Scapular mobilization;Manual Traction;Neural Stretch;Muscle Energy Technique;Myofascial release   Manual therapy comments for decreased muscle tightness and pain. passive stretching of bil (left < right) upper traps, rhomboids and scalenes.   Soft tissue mobilization to upper traps, rhomboids, cervical paraspinals and STM, left < right side for decreased tightness and pain   Manual Traction cervical manual distraction for 30-40 sec holds x  5 reps; suboccipital release for 30 sec holds x 6 reps.     Neck Exercises: Stretches   Upper Trapezius Stretch 2 reps;30 seconds;Limitations   Other Neck Stretches seated lateral flexion stretch for 30 sec's x 2 reps each side with cues on ex form/technique             PT Short Term Goals - 07/04/16 1016      PT SHORT TERM GOAL #1   Title The patient will be indep with HEP for scapular stabilization, neck stabilization, stretching.   Baseline Target date 08/04/2016   Time 4   Period Weeks     PT SHORT TERM GOAL #2   Title The patient will improve neck AROM rotation to > or equal to 65 degrees bilaterally.   Baseline Target date 08/04/2016   Time 4   Period Weeks     PT SHORT TERM GOAL #3   Title The  patient will report pain at rest < or equal to 1/10 to demo improving neck discomfort.   Baseline Target date 08/04/2016   Time 4   Period Weeks     PT SHORT TERM GOAL #4   Title The patient will return demo sleeping positions for home to gain greater comfort at night.   Baseline Target date 08/04/2016   Time 4   Period Weeks           PT Long Term Goals - 07/04/16 1017      PT LONG TERM GOAL #1   Title The patient will reduce NDI from 26% to < or equal to 12% to demo dec'd self perception of neck disability.   Baseline Target date  08/18/2016   Time 6   Period Weeks     PT LONG TERM GOAL #2   Title The patient will return demo correct body mechanics during work simulation tasks.   Baseline Target date 08/18/2016   Time 6   Period Weeks     PT LONG TERM GOAL #3   Title The patient will be indep with progression of HEP for post d/c strengthening/flexibility.   Baseline Target date  08/18/2016   Time 6   Period Weeks               Plan - 07/07/16 1409    Clinical Impression Statement Today's skilled session continued to address pain reduction and cervical stretching/strengthening with no issues reported. Pt guarded with stretches and exercises, however would relax with cues. Pt is making steady progress toward goals and should benefit from continued PT to progress toward unmet goals.    Rehab Potential Good   PT Frequency 2x / week   PT Duration 2 weeks  then 1x/week for 4 weeks   PT Treatment/Interventions ADLs/Self Care Home Management;Cryotherapy;Therapeutic activities;Therapeutic exercise;Neuromuscular re-education;Patient/family education;Passive range of motion;Manual techniques;Moist Heat   PT Next Visit Plan scapular strengthening/ chest stretching, postural training, sleep positions, spinal stabilization; soft tissue mobilization neck and parascapular region    Consulted and Agree with Plan of Care Patient      Patient will benefit from skilled therapeutic  intervention in order to improve the following deficits and impairments:  Pain, Decreased range of motion, Increased muscle spasms, Decreased strength, Postural dysfunction, Impaired flexibility, Improper body mechanics  Visit Diagnosis: Neck pain  Abnormal posture     Problem List Patient Active Problem List   Diagnosis Date Noted  . Cervical strain 06/05/2016  . Headache(784.0) 12/24/2012  . Dizziness and giddiness 12/24/2012  .  Back pain 10/14/2012  . GERD (gastroesophageal reflux disease) 08/12/2011  . Vitamin D deficiency   . Heart murmur    [place], [place], [facility] [address], [place], [place] [address] [phone] 07/07/16, 3:01 PM   Name: Maret Diaz MRN: [id number] Date of Birth: [birth year]

## 2016-07-10 ENCOUNTER — Ambulatory Visit: Payer: 59 | Admitting: Physical Therapy

## 2016-07-10 DIAGNOSIS — M542 Cervicalgia: Secondary | ICD-10-CM

## 2016-07-10 DIAGNOSIS — R293 Abnormal posture: Secondary | ICD-10-CM

## 2016-07-10 NOTE — Addendum Note (Signed)
Addended by: Laureen Abrahams on: 07/10/2016 02:33 PM   Modules accepted: Orders

## 2016-07-10 NOTE — Therapy (Addendum)
[facility] [address] [place], [place], [address] Phone: [phone]   Fax:  [phone]  Physical Therapy Treatment  Patient Details  Name: Ana Gross MRN: [id number] Date of Birth: 25-Apr-1971 Referring Provider: Lenor Coffin, MD  Encounter Date: 07/10/2016      PT End of Session - 07/10/16 0908    Visit Number 3   Number of Visits 8   Date for PT Re-Evaluation 08/18/16   Authorization Type private insurance   PT Start Time 0800   PT Stop Time 0901   PT Time Calculation (min) 61 min   Activity Tolerance Patient tolerated treatment well   Behavior During Therapy [facility] for tasks assessed/performed      Past Medical History:  Diagnosis Date  . Allergy   . Dizziness and giddiness 12/24/2012  . GERD (gastroesophageal reflux disease)   . Headache(784.0) 12/24/2012  . Heart murmur   . Obesity   . Reactive airways dysfunction syndrome   . Vitamin D deficiency     Past Surgical History:  Procedure Laterality Date  . ENDOMETRIAL ABLATION    . FOOT SURGERY Left 2006  . TONSILLECTOMY AND ADENOIDECTOMY      There were no vitals filed for this visit.      Subjective Assessment - 07/10/16 0802    Subjective shoulder and neck are both sore; neck feels stiff today   Patient Stated Goals reduce pain   Currently in Pain? Yes   Pain Score 4    Pain Location Neck   Pain Orientation Right   Pain Descriptors / Indicators Aching  "a little more than annoying"   Pain Type Chronic pain   Pain Radiating Towards intor R shoulder and parascapular symptoms   Pain Onset More than a month ago   Pain Frequency Intermittent                         [facility] Adult PT Treatment/Exercise - 07/10/16 0810      Neck Exercises: Standing   Other Standing Exercises scap retraction (elbows extended and flexed) 2x10 with green theraband     Neck Exercises: Supine   Neck Retraction 20 reps;5 secs   Neck  Retraction Limitations on towel roll   Shoulder ABduction 20 reps;Both   Shoulder Abduction Limitations green theraband on towel roll   Other Supine Exercise bil external rotation with green theraband 2x10 on towel roll     Modalities   Modalities Moist Heat;Electrical Stimulation     Moist Heat Therapy   Number Minutes Moist Heat 12 Minutes   Moist Heat Location Cervical     Electrical Stimulation   Electrical Stimulation Location R neck/shoulder   Electrical Stimulation Action IFC   Electrical Stimulation Parameters to tolerance   Electrical Stimulation Goals Pain;Tone     Manual Therapy   Manual Therapy Soft tissue mobilization   Manual therapy comments pt supine   Soft tissue mobilization to upper traps, rhomboids, cervical paraspinals and STM, left < right side for decreased tightness and pain     Neck Exercises: Stretches   Chest Stretch --  towel stretch x 3 min                  PT Short Term Goals - 07/04/16 1016      PT SHORT TERM GOAL #1   Title The patient will be indep with HEP for scapular stabilization, neck stabilization, stretching.   Baseline Target date  08/04/2016   Time 4   Period Weeks     PT SHORT TERM GOAL #2   Title The patient will improve neck AROM rotation to > or equal to 65 degrees bilaterally.   Baseline Target date 08/04/2016   Time 4   Period Weeks     PT SHORT TERM GOAL #3   Title The patient will report pain at rest < or equal to 1/10 to demo improving neck discomfort.   Baseline Target date 08/04/2016   Time 4   Period Weeks     PT SHORT TERM GOAL #4   Title The patient will return demo sleeping positions for home to gain greater comfort at night.   Baseline Target date 08/04/2016   Time 4   Period Weeks           PT Long Term Goals - 07/04/16 1017      PT LONG TERM GOAL #1   Title The patient will reduce NDI from 26% to < or equal to 12% to demo dec'd self perception of neck disability.   Baseline Target date   08/18/2016   Time 6   Period Weeks     PT LONG TERM GOAL #2   Title The patient will return demo correct body mechanics during work simulation tasks.   Baseline Target date 08/18/2016   Time 6   Period Weeks     PT LONG TERM GOAL #3   Title The patient will be indep with progression of HEP for post d/c strengthening/flexibility.   Baseline Target date  08/18/2016   Time 6   Period Weeks               Plan - 07/10/16 0908    Clinical Impression Statement Pt continues to rate pain same as previous sessions and continues to have tightness in R cervical and shoulder muscles (anterior and post).  Feel pt may benefit from TDN and discussed with pt this option.  Pt open to try and therefore will plan to schedule at least 1x/wk at [facility] location.  Pt agreeable.   PT Treatment/Interventions ADLs/Self Care Home Management;Cryotherapy;Therapeutic activities;Therapeutic exercise;Neuromuscular re-education;Patient/family education;Passive range of motion;Manual techniques;Moist Heat;Dry needling   PT Next Visit Plan scapular strengthening/ chest stretching, postural training, sleep positions, spinal stabilization; soft tissue mobilization neck and parascapular region; TDN as indicated   Consulted and Agree with Plan of Care Patient      Patient will benefit from skilled therapeutic intervention in order to improve the following deficits and impairments:  Pain, Decreased range of motion, Increased muscle spasms, Decreased strength, Postural dysfunction, Impaired flexibility, Improper body mechanics  Visit Diagnosis: Neck pain - Plan: PT plan of care cert/re-cert  Abnormal posture - Plan: PT plan of care cert/re-cert     Problem List Patient Active Problem List   Diagnosis Date Noted  . Cervical strain 06/05/2016  . Headache(784.0) 12/24/2012  . Dizziness and giddiness 12/24/2012  . Back pain 10/14/2012  . GERD (gastroesophageal reflux disease) 08/12/2011  . Vitamin D deficiency    . Heart murmur        Laureen Abrahams, PT, DPT 07/10/16 2:32 PM     [facility] [address] [place], [place], [address] Phone: [phone]   Fax:  [phone]  Name: Luttie Sparhawk MRN: [id number] Date of Birth: Jul 25, 1970

## 2016-07-15 ENCOUNTER — Ambulatory Visit: Payer: 59 | Admitting: Physical Therapy

## 2016-07-15 DIAGNOSIS — M542 Cervicalgia: Secondary | ICD-10-CM | POA: Diagnosis not present

## 2016-07-15 DIAGNOSIS — R293 Abnormal posture: Secondary | ICD-10-CM

## 2016-07-15 NOTE — Therapy (Signed)
[facility] [address] [place], [place], [address] Phone: [phone]   Fax:  [phone]  Physical Therapy Treatment  Patient Details  Name: Ana Gross MRN: [id number] Date of Birth: 05/07/1971 Referring Provider: Lenor Coffin, MD  Encounter Date: 07/15/2016      PT End of Session - 07/15/16 0924    Visit Number 4   Number of Visits 8   Date for PT Re-Evaluation 08/18/16   Authorization Type private insurance   PT Start Time [phone]   PT Stop Time 0924   PT Time Calculation (min) 42 min   Activity Tolerance Patient tolerated treatment well   Behavior During Therapy [facility] for tasks assessed/performed      Past Medical History:  Diagnosis Date  . Allergy   . Dizziness and giddiness 12/24/2012  . GERD (gastroesophageal reflux disease)   . Headache(784.0) 12/24/2012  . Heart murmur   . Obesity   . Reactive airways dysfunction syndrome   . Vitamin D deficiency     Past Surgical History:  Procedure Laterality Date  . ENDOMETRIAL ABLATION    . FOOT SURGERY Left 2006  . TONSILLECTOMY AND ADENOIDECTOMY      There were no vitals filed for this visit.      Subjective Assessment - 07/15/16 0843    Subjective neck is stiff and sore today.  hasn't been on her regular routine today.   Patient Stated Goals reduce pain   Currently in Pain? Yes   Pain Score 2    Pain Location Neck   Pain Orientation Right   Pain Descriptors / Indicators Aching;Sore;Tightness   Pain Type Chronic pain   Pain Onset More than a month ago   Pain Frequency Intermittent   Aggravating Factors  lifting overhead   Pain Relieving Factors stretching, ice at times, "[place]" gell                         [facility] Adult PT Treatment/Exercise - 07/15/16 0845      Neck Exercises: Seated   Cervical Rotation 10 reps;Both     Modalities   Modalities Traction     Traction   Type of Traction Cervical  home unit   Min  (lbs) 10   Max (lbs) 20   Hold Time 60   Rest Time 20   Time 10     Manual Therapy   Manual Therapy Soft tissue mobilization;Manual Traction   Manual therapy comments pt supine   Soft tissue mobilization to upper traps, rhomboids, cervical paraspinals and STM, left < right side for decreased tightness and pain   Manual Traction cervical manual distraction for 30-40 sec holds x 5 reps; suboccipital release for 30 sec holds x 6 reps.     Neck Exercises: Stretches   Upper Trapezius Stretch 2 reps;30 seconds   Upper Trapezius Stretch Limitations bil   Levator Stretch 2 reps;30 seconds   Levator Stretch Limitations bil   Other Neck Stretches --                  PT Short Term Goals - 07/04/16 1016      PT SHORT TERM GOAL #1   Title The patient will be indep with HEP for scapular stabilization, neck stabilization, stretching.   Baseline Target date 08/04/2016   Time 4   Period Weeks     PT SHORT TERM GOAL #2   Title The patient will improve neck AROM  rotation to > or equal to 65 degrees bilaterally.   Baseline Target date 08/04/2016   Time 4   Period Weeks     PT SHORT TERM GOAL #3   Title The patient will report pain at rest < or equal to 1/10 to demo improving neck discomfort.   Baseline Target date 08/04/2016   Time 4   Period Weeks     PT SHORT TERM GOAL #4   Title The patient will return demo sleeping positions for home to gain greater comfort at night.   Baseline Target date 08/04/2016   Time 4   Period Weeks           PT Long Term Goals - 07/04/16 1017      PT LONG TERM GOAL #1   Title The patient will reduce NDI from 26% to < or equal to 12% to demo dec'd self perception of neck disability.   Baseline Target date  08/18/2016   Time 6   Period Weeks     PT LONG TERM GOAL #2   Title The patient will return demo correct body mechanics during work simulation tasks.   Baseline Target date 08/18/2016   Time 6   Period Weeks     PT LONG TERM GOAL #3    Title The patient will be indep with progression of HEP for post d/c strengthening/flexibility.   Baseline Target date  08/18/2016   Time 6   Period Weeks               Plan - 07/15/16 [id number]    Clinical Impression Statement Pt tolerated session today with reports of improved mobility and decreased stiffness.  Will continue to benefit from PT to maximize function and improve pain.   PT Treatment/Interventions ADLs/Self Care Home Management;Cryotherapy;Therapeutic activities;Therapeutic exercise;Neuromuscular re-education;Patient/family education;Passive range of motion;Manual techniques;Moist Heat;Dry needling   PT Next Visit Plan assess response to traction; scapular strengthening/ chest stretching, postural training, sleep positions, spinal stabilization; soft tissue mobilization neck and parascapular region; TDN as indicated   Consulted and Agree with Plan of Care Patient      Patient will benefit from skilled therapeutic intervention in order to improve the following deficits and impairments:  Pain, Decreased range of motion, Increased muscle spasms, Decreased strength, Postural dysfunction, Impaired flexibility, Improper body mechanics  Visit Diagnosis: Neck pain  Abnormal posture     Problem List Patient Active Problem List   Diagnosis Date Noted  . Cervical strain 06/05/2016  . Headache(784.0) 12/24/2012  . Dizziness and giddiness 12/24/2012  . Back pain 10/14/2012  . GERD (gastroesophageal reflux disease) 08/12/2011  . Vitamin D deficiency   . Heart murmur        Laureen Abrahams, PT, DPT 07/15/16 9:26 AM    [facility] [address] [place], [place], [address] Phone: [phone]   Fax:  [phone]  Name: Ana Gross MRN: [id number] Date of Birth: 1970-11-11

## 2016-07-17 ENCOUNTER — Encounter: Payer: Self-pay | Admitting: Physical Therapy

## 2016-07-17 ENCOUNTER — Ambulatory Visit: Payer: 59 | Admitting: Physical Therapy

## 2016-07-17 DIAGNOSIS — M542 Cervicalgia: Secondary | ICD-10-CM

## 2016-07-17 DIAGNOSIS — R293 Abnormal posture: Secondary | ICD-10-CM

## 2016-07-17 NOTE — Therapy (Signed)
[facility] [address] [place], [place], [address] Phone: [phone]   Fax:  [phone]  Physical Therapy Treatment  Patient Details  Name: Ana Gross MRN: [id number] Date of Birth: 1970/07/25 Referring Provider: Lenor Coffin, MD  Encounter Date: 07/17/2016      PT End of Session - 07/17/16 0851    Visit Number 5   Number of Visits 8   Date for PT Re-Evaluation 08/18/16   Authorization Type private insurance   PT Start Time [phone]   PT Stop Time 0927   PT Time Calculation (min) 41 min   Activity Tolerance Patient tolerated treatment well   Behavior During Therapy [facility] for tasks assessed/performed      Past Medical History:  Diagnosis Date  . Allergy   . Dizziness and giddiness 12/24/2012  . GERD (gastroesophageal reflux disease)   . Headache(784.0) 12/24/2012  . Heart murmur   . Obesity   . Reactive airways dysfunction syndrome   . Vitamin D deficiency     Past Surgical History:  Procedure Laterality Date  . ENDOMETRIAL ABLATION    . FOOT SURGERY Left 2006  . TONSILLECTOMY AND ADENOIDECTOMY      There were no vitals filed for this visit.      Subjective Assessment - 07/17/16 0850    Subjective Does not feel the traction helped, "I don't like it".  Also having tingling in right arm today.   Patient Stated Goals reduce pain   Currently in Pain? Yes   Pain Score 3    Pain Location Neck   Pain Orientation Right   Pain Type Chronic pain   Pain Radiating Towards into right scapular area and UE   Pain Onset More than a month ago   Pain Frequency Intermittent   Aggravating Factors  lifting overhead   Pain Relieving Factors stretching, ice at times, "[place]" gel           [facility] Adult PT Treatment/Exercise - 07/17/16 0852      Neck Exercises: Machines for Strengthening   UBE (Upper Arm Bike) Level 2.0 x 2 minutes each forward/backwards with emphasis on posture and ex form     Neck  Exercises: Theraband   Scapula Retraction 10 reps;Green;Limitations   Scapula Retraction Limitations cues on correct posture and ex form   Shoulder Extension 10 reps;Green;Limitations   Shoulder Extension Limitations cues on correct posture and ex form   Horizontal ABduction Green;10 reps;Limitations   Horizontal ABduction Limitations cues on correct ex form and posture   Other Theraband Exercises alternating diagonals x 10 reps each way with cues on form/technique with green band     Neck Exercises: Standing   Other Standing Exercises red pball at wall: rolling ball up wall for flexion stretch for 5 sec holds x 10 reps; push ups x 10 reps with cues on ex form/technique     Manual Therapy   Manual Therapy Soft tissue mobilization;Manual Traction;Scapular mobilization;Myofascial release   Manual therapy comments pt supine, then in left sidelying.    Soft tissue mobilization to upper traps, rhomboids, cervical paraspinals, right subscapular area and STM, left < right side for decreased tightness and pain   Scapular Mobilization with pt in left sidelying: to right scapla with prolonged stretches performed concurrently.    Manual Traction cervical manual distraction for 30-40 sec holds x 5 reps; suboccipital release for 30 sec holds x 6 reps.     Neck Exercises: Stretches   Upper Trapezius Stretch  20 seconds;3 reps   Upper Trapezius Stretch Limitations bil             PT Short Term Goals - 07/04/16 1016      PT SHORT TERM GOAL #1   Title The patient will be indep with HEP for scapular stabilization, neck stabilization, stretching.   Baseline Target date 08/04/2016   Time 4   Period Weeks     PT SHORT TERM GOAL #2   Title The patient will improve neck AROM rotation to > or equal to 65 degrees bilaterally.   Baseline Target date 08/04/2016   Time 4   Period Weeks     PT SHORT TERM GOAL #3   Title The patient will report pain at rest < or equal to 1/10 to demo improving neck  discomfort.   Baseline Target date 08/04/2016   Time 4   Period Weeks     PT SHORT TERM GOAL #4   Title The patient will return demo sleeping positions for home to gain greater comfort at night.   Baseline Target date 08/04/2016   Time 4   Period Weeks           PT Long Term Goals - 07/04/16 1017      PT LONG TERM GOAL #1   Title The patient will reduce NDI from 26% to < or equal to 12% to demo dec'd self perception of neck disability.   Baseline Target date  08/18/2016   Time 6   Period Weeks     PT LONG TERM GOAL #2   Title The patient will return demo correct body mechanics during work simulation tasks.   Baseline Target date 08/18/2016   Time 6   Period Weeks     PT LONG TERM GOAL #3   Title The patient will be indep with progression of HEP for post d/c strengthening/flexibility.   Baseline Target date  08/18/2016   Time 6   Period Weeks           Plan - 07/17/16 [id number]    Clinical Impression Statement todays session continued to focus on decreased pain and increased mobility. Pt did report no tingling in right hand/UE at end of session. Pt is progressing towards goals and should benefit from continued PT to progress toward unmet goals.   PT Treatment/Interventions ADLs/Self Care Home Management;Cryotherapy;Therapeutic activities;Therapeutic exercise;Neuromuscular re-education;Patient/family education;Passive range of motion;Manual techniques;Moist Heat;Dry needling   PT Next Visit Plan scapular strengthening/ chest stretching, postural training, sleep positions, spinal stabilization; soft tissue mobilization neck and parascapular region   Consulted and Agree with Plan of Care Patient      Patient will benefit from skilled therapeutic intervention in order to improve the following deficits and impairments:  Pain, Decreased range of motion, Increased muscle spasms, Decreased strength, Postural dysfunction, Impaired flexibility, Improper body mechanics  Visit  Diagnosis: Neck pain  Abnormal posture     Problem List Patient Active Problem List   Diagnosis Date Noted  . Cervical strain 06/05/2016  . Headache(784.0) 12/24/2012  . Dizziness and giddiness 12/24/2012  . Back pain 10/14/2012  . GERD (gastroesophageal reflux disease) 08/12/2011  . Vitamin D deficiency   . Heart murmur     [place], [place], [facility] [address], [place], [place] [address] [phone] 07/17/16, 12:19 PM   Name: Caeden Mayne MRN: [id number] Date of Birth: 16-Nov-1970

## 2016-07-22 ENCOUNTER — Ambulatory Visit: Payer: 59 | Attending: Neurology | Admitting: Physical Therapy

## 2016-07-22 DIAGNOSIS — M542 Cervicalgia: Secondary | ICD-10-CM | POA: Diagnosis present

## 2016-07-22 DIAGNOSIS — R293 Abnormal posture: Secondary | ICD-10-CM | POA: Diagnosis not present

## 2016-07-22 NOTE — Therapy (Signed)
[facility] [place], [place], [address] Phone: [phone]   Fax:  [phone]  Physical Therapy Treatment  Patient Details  Name: Ana Gross MRN: [id number] Date of Birth: 1970-10-07 Referring Provider: Lenor Coffin, MD  Encounter Date: 07/22/2016      PT End of Session - 07/22/16 0906    Visit Number 6   Number of Visits 8   Date for PT Re-Evaluation 08/18/16   Authorization Type private insurance   PT Start Time [phone]   PT Stop Time 1006   PT Time Calculation (min) 60 min   Activity Tolerance Patient tolerated treatment well   Behavior During Therapy [facility] for tasks assessed/performed      Past Medical History:  Diagnosis Date  . Allergy   . Dizziness and giddiness 12/24/2012  . GERD (gastroesophageal reflux disease)   . Headache(784.0) 12/24/2012  . Heart murmur   . Obesity   . Reactive airways dysfunction syndrome   . Vitamin D deficiency     Past Surgical History:  Procedure Laterality Date  . ENDOMETRIAL ABLATION    . FOOT SURGERY Left 2006  . TONSILLECTOMY AND ADENOIDECTOMY      There were no vitals filed for this visit.      Subjective Assessment - 07/22/16 0910    Subjective Patient reports she has a bulging disc in her neck which intermittently radiates into her RUE. Today she says she is just a little sore in her neck and shoulder.   Patient Stated Goals reduce pain   Currently in Pain? Yes   Pain Score 3    Pain Location Neck   Pain Orientation Right   Pain Descriptors / Indicators Sore   Pain Type Chronic pain   Pain Onset More than a month ago   Pain Frequency Intermittent   Aggravating Factors  unsure                         [facility] Adult PT Treatment/Exercise - 07/22/16 0001      Neck Exercises: Machines for Strengthening   UBE (Upper Arm Bike) Level 2.0 x 2 minutes each forward/backwards      Modalities   Modalities Electrical Stimulation;Moist Heat      Electrical Stimulation   Electrical Stimulation Location R neck and scapula premod 80-150 Hz x 15 min to tolerance   Electrical Stimulation Goals Pain     Manual Therapy   Manual Therapy Soft tissue mobilization   Soft tissue mobilization STW/MFR to R UT, lev scap, cervical paraspinals and subocciptials          Trigger Point Dry Needling - 07/22/16 1204    Consent Given? Yes   Education Handout Provided Yes   Muscles Treated Upper Body Upper trapezius;Suboccipitals muscle group;Levator scapulae   Upper Trapezius Response Twitch reponse elicited;Palpable increased muscle length  R   SubOccipitals Response Twitch response elicited;Palpable increased muscle length  R   Levator Scapulae Response Twitch response elicited;Palpable increased muscle length  R cervical paraspinals              PT Education - 07/22/16 1204    Education provided Yes   Education Details Dry needling education and aftercare   Person(s) Educated Patient   Methods Explanation;Handout   Comprehension Verbalized understanding          PT Short Term Goals - 07/04/16 1016      PT SHORT TERM GOAL #1   Title The  patient will be indep with HEP for scapular stabilization, neck stabilization, stretching.   Baseline Target date 08/04/2016   Time 4   Period Weeks     PT SHORT TERM GOAL #2   Title The patient will improve neck AROM rotation to > or equal to 65 degrees bilaterally.   Baseline Target date 08/04/2016   Time 4   Period Weeks     PT SHORT TERM GOAL #3   Title The patient will report pain at rest < or equal to 1/10 to demo improving neck discomfort.   Baseline Target date 08/04/2016   Time 4   Period Weeks     PT SHORT TERM GOAL #4   Title The patient will return demo sleeping positions for home to gain greater comfort at night.   Baseline Target date 08/04/2016   Time 4   Period Weeks           PT Long Term Goals - 07/04/16 1017      PT LONG TERM GOAL #1   Title The  patient will reduce NDI from 26% to < or equal to 12% to demo dec'd self perception of neck disability.   Baseline Target date  08/18/2016   Time 6   Period Weeks     PT LONG TERM GOAL #2   Title The patient will return demo correct body mechanics during work simulation tasks.   Baseline Target date 08/18/2016   Time 6   Period Weeks     PT LONG TERM GOAL #3   Title The patient will be indep with progression of HEP for post d/c strengthening/flexibility.   Baseline Target date  08/18/2016   Time 6   Period Weeks               Plan - 07/22/16 1205    Clinical Impression Statement Patient demos increased CT kyphosis and has increased tone and trigger points in R UT and cervical paraspinals. She responded well to DN and manual therapy with noticeable decrease in tissue tension.   Rehab Potential Good   PT Treatment/Interventions ADLs/Self Care Home Management;Cryotherapy;Therapeutic activities;Therapeutic exercise;Neuromuscular re-education;Patient/family education;Passive range of motion;Manual techniques;Moist Heat;Dry needling   PT Next Visit Plan Assess response to DN; continue scapular strengthening/ chest stretching, postural training, sleep positions, spinal stabilization; soft tissue mobilization neck and parascapular region      Patient will benefit from skilled therapeutic intervention in order to improve the following deficits and impairments:  Pain, Decreased range of motion, Increased muscle spasms, Decreased strength, Postural dysfunction, Impaired flexibility, Improper body mechanics  Visit Diagnosis: Neck pain  Abnormal posture     Problem List Patient Active Problem List   Diagnosis Date Noted  . Cervical strain 06/05/2016  . Headache(784.0) 12/24/2012  . Dizziness and giddiness 12/24/2012  . Back pain 10/14/2012  . GERD (gastroesophageal reflux disease) 08/12/2011  . Vitamin D deficiency   . Heart murmur     Madelyn Flavors PT 07/22/2016, 12:30  PM  [facility] [place], [place], [address] Phone: [phone]   Fax:  [phone]  Name: Ana Gross MRN: [id number] Date of Birth: 08/06/1970

## 2016-07-22 NOTE — Patient Instructions (Signed)
Trigger Point Dry Needling  . What is Trigger Point Dry Needling (DN)? o DN is a physical therapy technique used to treat muscle pain and dysfunction. Specifically, DN helps deactivate muscle trigger points (muscle knots).  o A thin filiform needle is used to penetrate the skin and stimulate the underlying trigger point. The goal is for a local twitch response (LTR) to occur and for the trigger point to relax. No medication of any kind is injected during the procedure.   . What Does Trigger Point Dry Needling Feel Like?  o The procedure feels different for each individual patient. Some patients report that they do not actually feel the needle enter the skin and overall the process is not painful. Very mild bleeding may occur. However, many patients feel a deep cramping in the muscle in which the needle was inserted. This is the local twitch response.   . How Will I feel after the treatment? o Soreness is normal, and the onset of soreness may not occur for a few hours. Typically this soreness does not last longer than two days.  o Bruising is uncommon, however; ice can be used to decrease any possible bruising.  o In rare cases feeling tired or nauseous after the treatment is normal. In addition, your symptoms may get worse before they get better, this period will typically not last longer than 24 hours.   . What Can I do After My Treatment? o Increase your hydration by drinking more water for the next 24 hours. o You may place ice or heat on the areas treated that have become sore, however, do not use heat on inflamed or bruised areas. Heat often brings more relief post needling. o You can continue your regular activities, but vigorous activity is not recommended initially after the treatment for 24 hours. o DN is best combined with other physical therapy such as strengthening, stretching, and other therapies.    Precautions:  In some cases, dry needling is done over the lung field. While rare,  there is a risk of pneumothorax (punctured lung). Because of this, if you ever experience shortness of breath on exertion, difficulty taking a deep breath, chest pain or a dry cough following dry needling, you should report to an emergency room and tell them that you have been dry needled over the thorax.  

## 2016-07-24 ENCOUNTER — Ambulatory Visit: Payer: 59 | Admitting: Physical Therapy

## 2016-07-24 ENCOUNTER — Encounter: Payer: Self-pay | Admitting: Physical Therapy

## 2016-07-24 DIAGNOSIS — M542 Cervicalgia: Secondary | ICD-10-CM | POA: Diagnosis not present

## 2016-07-24 DIAGNOSIS — R293 Abnormal posture: Secondary | ICD-10-CM

## 2016-07-25 NOTE — Therapy (Signed)
[facility] [address] [place], [place], [address] Phone: [phone]   Fax:  [phone]  Physical Therapy Treatment  Patient Details  Name: Ana Gross MRN: [id number] Date of Birth: 12/19/1970 Referring Provider: Lenor Coffin, MD  Encounter Date: 07/24/2016      PT End of Session - 07/24/16 1111    Visit Number 7   Number of Visits 8   Date for PT Re-Evaluation 08/18/16   Authorization Type private insurance   PT Start Time 1105   PT Stop Time 1200   PT Time Calculation (min) 55 min   Activity Tolerance Patient tolerated treatment well   Behavior During Therapy [facility] for tasks assessed/performed      Past Medical History:  Diagnosis Date  . Allergy   . Dizziness and giddiness 12/24/2012  . GERD (gastroesophageal reflux disease)   . Headache(784.0) 12/24/2012  . Heart murmur   . Obesity   . Reactive airways dysfunction syndrome   . Vitamin D deficiency     Past Surgical History:  Procedure Laterality Date  . ENDOMETRIAL ABLATION    . FOOT SURGERY Left 2006  . TONSILLECTOMY AND ADENOIDECTOMY      There were no vitals filed for this visit.      Subjective Assessment - 07/24/16 1108    Subjective Reports she was really sore yesterday after having dry needling, however does feel she is less "puffy" in the shoulder area with decreased tightness.    Patient Stated Goals reduce pain   Currently in Pain? Yes   Pain Score 2    Pain Location Neck   Pain Orientation Right   Pain Descriptors / Indicators Sore   Pain Type Chronic pain   Pain Radiating Towards into right scapular area and UE   Pain Onset More than a month ago   Pain Frequency Intermittent   Aggravating Factors  unsure   Pain Relieving Factors stretching, ice at times, "[place]" gel            [facility] Adult PT Treatment/Exercise - 07/24/16 1112      Neck Exercises: Machines for Strengthening   UBE (Upper Arm Bike) Level 2.0 x  2 minutes each forward/backwards      Neck Exercises: Theraband   Scapula Retraction 10 reps;Green;Limitations   Scapula Retraction Limitations cues on correct posture and ex form   Shoulder Extension 10 reps;Green;Limitations   Shoulder Extension Limitations cues on correct posture and ex form   Horizontal ABduction Green;10 reps;Limitations   Horizontal ABduction Limitations cues on correct ex form and posture   Other Theraband Exercises alternating diagonals x 10 reps each way with cues on form/technique with green band     Moist Heat Therapy   Number Minutes Moist Heat 15 Minutes   Moist Heat Location Cervical     Electrical Stimulation   Electrical Stimulation Location bil cervical paraspinals and upper traps concurrent with MHP   Electrical Stimulation Action IFC   Electrical Stimulation Parameters intensity to tolerance x 15 minutes   Electrical Stimulation Goals Pain     Manual Therapy   Manual Therapy Soft tissue mobilization;Manual Traction   Manual therapy comments pt supine, then in left sidelying.    Soft tissue mobilization STW/MFR to R UT, lev scap, cervical paraspinals and subocciptials   Scapular Mobilization with pt in left sidelying: to right scapla with prolonged stretches performed concurrently.    Manual Traction cervical manual distraction for 30-40 sec holds x 5 reps; suboccipital  release for 30 sec holds x 6 reps.     Neck Exercises: Stretches   Upper Trapezius Stretch 30 seconds;Limitations   Upper Trapezius Stretch Limitations bil sides with cues on technique and hold times            PT Short Term Goals - 07/04/16 1016      PT SHORT TERM GOAL #1   Title The patient will be indep with HEP for scapular stabilization, neck stabilization, stretching.   Baseline Target date 08/04/2016   Time 4   Period Weeks     PT SHORT TERM GOAL #2   Title The patient will improve neck AROM rotation to > or equal to 65 degrees bilaterally.   Baseline Target date  08/04/2016   Time 4   Period Weeks     PT SHORT TERM GOAL #3   Title The patient will report pain at rest < or equal to 1/10 to demo improving neck discomfort.   Baseline Target date 08/04/2016   Time 4   Period Weeks     PT SHORT TERM GOAL #4   Title The patient will return demo sleeping positions for home to gain greater comfort at night.   Baseline Target date 08/04/2016   Time 4   Period Weeks           PT Long Term Goals - 07/04/16 1017      PT LONG TERM GOAL #1   Title The patient will reduce NDI from 26% to < or equal to 12% to demo dec'd self perception of neck disability.   Baseline Target date  08/18/2016   Time 6   Period Weeks     PT LONG TERM GOAL #2   Title The patient will return demo correct body mechanics during work simulation tasks.   Baseline Target date 08/18/2016   Time 6   Period Weeks     PT LONG TERM GOAL #3   Title The patient will be indep with progression of HEP for post d/c strengthening/flexibility.   Baseline Target date  08/18/2016   Time 6   Period Weeks             Plan - 07/24/16 1111    Clinical Impression Statement Today's session continued to address decreased pain/tightness and cervical/scapular strengthening without any issues reported. Pt has another appt for DN scheduled next week. Pt is progressing toward goals and should benefit from continued PT to progress toward unmet goals.    Rehab Potential Good   PT Treatment/Interventions ADLs/Self Care Home Management;Cryotherapy;Therapeutic activities;Therapeutic exercise;Neuromuscular re-education;Patient/family education;Passive range of motion;Manual techniques;Moist Heat;Dry needling   PT Next Visit Plan Assess response to DN; continue scapular strengthening/ chest stretching, postural training, sleep positions, spinal stabilization; soft tissue mobilization neck and parascapular region      Patient will benefit from skilled therapeutic intervention in order to improve the  following deficits and impairments:  Pain, Decreased range of motion, Increased muscle spasms, Decreased strength, Postural dysfunction, Impaired flexibility, Improper body mechanics  Visit Diagnosis: Neck pain  Abnormal posture     Problem List Patient Active Problem List   Diagnosis Date Noted  . Cervical strain 06/05/2016  . Headache(784.0) 12/24/2012  . Dizziness and giddiness 12/24/2012  . Back pain 10/14/2012  . GERD (gastroesophageal reflux disease) 08/12/2011  . Vitamin D deficiency   . Heart murmur     Willow Ora, [place], [facility] [address] [place], [place] [address] [phone] 07/25/16,  3:24 PM   Name: Ana Gross MRN: [id number] Date of Birth: Mar 04, 1971

## 2016-07-29 ENCOUNTER — Encounter: Payer: Self-pay | Admitting: Physical Therapy

## 2016-07-29 ENCOUNTER — Ambulatory Visit: Payer: 59 | Admitting: Physical Therapy

## 2016-07-29 DIAGNOSIS — M542 Cervicalgia: Secondary | ICD-10-CM

## 2016-07-29 DIAGNOSIS — R293 Abnormal posture: Secondary | ICD-10-CM

## 2016-07-29 NOTE — Therapy (Signed)
[facility] [address] [place], [place], [address] Phone: [phone]   Fax:  [phone]  Physical Therapy Treatment  Patient Details  Name: Ana Gross MRN: [id number] Date of Birth: 01/15/1971 Referring Provider: Lenor Coffin, MD  Encounter Date: 07/29/2016      PT End of Session - 07/29/16 1454    Visit Number 8   Number of Visits 8   Date for PT Re-Evaluation 08/18/16   Authorization Type private insurance   PT Start Time 1448   PT Stop Time 1541   PT Time Calculation (min) 53 min   Activity Tolerance Patient tolerated treatment well   Behavior During Therapy [facility] for tasks assessed/performed      Past Medical History:  Diagnosis Date  . Allergy   . Dizziness and giddiness 12/24/2012  . GERD (gastroesophageal reflux disease)   . Headache(784.0) 12/24/2012  . Heart murmur   . Obesity   . Reactive airways dysfunction syndrome   . Vitamin D deficiency     Past Surgical History:  Procedure Laterality Date  . ENDOMETRIAL ABLATION    . FOOT SURGERY Left 2006  . TONSILLECTOMY AND ADENOIDECTOMY      There were no vitals filed for this visit.      Subjective Assessment - 07/29/16 1450    Subjective Not nearly as sore as she was last session. Feels the tightness and pain are getting better. Feels the "puffiness" is better as well. Back to work this week, so states that will be the true challenge.   Patient Stated Goals reduce pain   Currently in Pain? Yes   Pain Score 2    Pain Location Neck   Pain Orientation Right   Pain Descriptors / Indicators Sore;Tightness   Pain Type Chronic pain   Pain Radiating Towards mostly noticing with driving at this time into the UE    Pain Onset More than a month ago   Pain Frequency Intermittent   Aggravating Factors  driving, poor posture   Pain Relieving Factors stretching, ice at times            [facility] Adult PT Treatment/Exercise - 07/29/16 1505       Neck Exercises: Machines for Strengthening   UBE (Upper Arm Bike) Level 2.5 x 2 minutes each forward/backwards      Neck Exercises: Theraband   Scapula Retraction 15 reps;Green;Limitations   Scapula Retraction Limitations cues on correct posture and ex form   Shoulder Extension 15 reps;Green;Limitations   Shoulder Extension Limitations cues on correct posture and ex form   Horizontal ABduction 15 reps;Green;Limitations   Horizontal ABduction Limitations cues on correct ex form and posture with pt standing with back to door frame to prevent compensatory motions                     Other Theraband Exercises alternating diagonals x 15 reps each way with cues on form/technique with green band with pt standing with back to door frame to prevent compensatory motions     Neck Exercises: Standing   Other Standing Exercises red pball at wall: rolling ball up wall for flexion stretch for 5 sec holds x 10 reps; push ups x 10 reps with cues on ex form/technique     Moist Heat Therapy   Number Minutes Moist Heat 15 Minutes   Moist Heat Location Cervical     Electrical Stimulation   Electrical Stimulation Location bil cervical paraspinals and upper traps concurrent  with MHP   Electrical Stimulation Action IFC   Electrical Stimulation Parameters intensity to tolerance   Electrical Stimulation Goals Pain     Manual Therapy   Manual Therapy Soft tissue mobilization;Manual Traction   Manual therapy comments pt in hook lying position, all manual therapy for decreased pain and decreaed muscular tightness.   Soft tissue mobilization STW/MFR to R UT, lev scap, cervical paraspinals and subocciptials   Manual Traction cervical manual distraction for 30-40 sec holds x 5 reps; suboccipital release for 30 sec holds x 6 reps.             PT Short Term Goals - 07/04/16 1016      PT SHORT TERM GOAL #1   Title The patient will be indep with HEP for scapular stabilization, neck stabilization,  stretching.   Baseline Target date 08/04/2016   Time 4   Period Weeks     PT SHORT TERM GOAL #2   Title The patient will improve neck AROM rotation to > or equal to 65 degrees bilaterally.   Baseline Target date 08/04/2016   Time 4   Period Weeks     PT SHORT TERM GOAL #3   Title The patient will report pain at rest < or equal to 1/10 to demo improving neck discomfort.   Baseline Target date 08/04/2016   Time 4   Period Weeks     PT SHORT TERM GOAL #4   Title The patient will return demo sleeping positions for home to gain greater comfort at night.   Baseline Target date 08/04/2016   Time 4   Period Weeks           PT Long Term Goals - 07/04/16 1017      PT LONG TERM GOAL #1   Title The patient will reduce NDI from 26% to < or equal to 12% to demo dec'd self perception of neck disability.   Baseline Target date  08/18/2016   Time 6   Period Weeks     PT LONG TERM GOAL #2   Title The patient will return demo correct body mechanics during work simulation tasks.   Baseline Target date 08/18/2016   Time 6   Period Weeks     PT LONG TERM GOAL #3   Title The patient will be indep with progression of HEP for post d/c strengthening/flexibility.   Baseline Target date  08/18/2016   Time 6   Period Weeks               Plan - 07/29/16 1454    Clinical Impression Statement Continued to work on decreased pain and on strengthening. Advanced exercises with increased reps/resistance without any issues reported. Pt should benefit from continued PT to progress toward goals.    Rehab Potential Good   PT Treatment/Interventions ADLs/Self Care Home Management;Cryotherapy;Therapeutic activities;Therapeutic exercise;Neuromuscular re-education;Patient/family education;Passive range of motion;Manual techniques;Moist Heat;Dry needling   PT Next Visit Plan Pt has 1 more visit for dry needling, continue as needed/indicated; continue scapular strengthening/ chest stretching, postural  training, sleep positions, spinal stabilization; soft tissue mobilization neck and parascapular region      Patient will benefit from skilled therapeutic intervention in order to improve the following deficits and impairments:  Pain, Decreased range of motion, Increased muscle spasms, Decreased strength, Postural dysfunction, Impaired flexibility, Improper body mechanics  Visit Diagnosis: Neck pain  Abnormal posture     Problem List Patient Active Problem List   Diagnosis Date Noted  . Cervical  strain 06/05/2016  . Headache(784.0) 12/24/2012  . Dizziness and giddiness 12/24/2012  . Back pain 10/14/2012  . GERD (gastroesophageal reflux disease) 08/12/2011  . Vitamin D deficiency   . Heart murmur     Willow Ora, [place], [facility] [address] [place], [place] [address] [phone] 07/29/16, 9:43 PM   Name: Ana Gross MRN: [id number] Date of Birth: 1971-02-12

## 2016-07-30 ENCOUNTER — Ambulatory Visit (INDEPENDENT_AMBULATORY_CARE_PROVIDER_SITE_OTHER): Payer: 59

## 2016-07-30 ENCOUNTER — Other Ambulatory Visit (INDEPENDENT_AMBULATORY_CARE_PROVIDER_SITE_OTHER): Payer: 59

## 2016-07-30 ENCOUNTER — Telehealth: Payer: Self-pay | Admitting: Neurology

## 2016-07-30 ENCOUNTER — Other Ambulatory Visit: Payer: Self-pay | Admitting: Neurology

## 2016-07-30 DIAGNOSIS — Z0289 Encounter for other administrative examinations: Secondary | ICD-10-CM

## 2016-07-30 DIAGNOSIS — R9089 Other abnormal findings on diagnostic imaging of central nervous system: Secondary | ICD-10-CM

## 2016-07-30 NOTE — Telephone Encounter (Signed)
I called patient. The transcanal Doppler study with monitoring did not show any right to left shunt flow in the heart. If the patient is amenable to a lumbar puncture, I will get this set up to exclude demyelinating disease as an etiology for the white matter changes in the brain. She is to call our office if she is amenable to this.

## 2016-07-30 NOTE — Progress Notes (Signed)
Pt here for bubble study at [facility]. Pt had 22 gauge inserted in left ac with no issues. Skin was clean with alcohol prep. IV flush with good blood return. IV flush with no issues. Patient tolerated well. IV was tape with dressing.Skin clean and intact.

## 2016-08-01 ENCOUNTER — Ambulatory Visit: Payer: Self-pay | Admitting: Rehabilitative and Restorative Service Providers"

## 2016-08-01 ENCOUNTER — Ambulatory Visit: Payer: 59 | Admitting: Physical Therapy

## 2016-08-01 DIAGNOSIS — M542 Cervicalgia: Secondary | ICD-10-CM | POA: Diagnosis not present

## 2016-08-01 NOTE — Therapy (Signed)
[facility] [place], [place], [address] Phone: [phone]   Fax:  [phone]  Physical Therapy Treatment  Patient Details  Name: Ana Gross MRN: [id number] Date of Birth: [birth year] Referring Provider: Lenor Coffin, MD  Encounter Date: 08/01/2016      PT End of Session - 08/01/16 1037    Visit Number 9   Number of Visits 14   Date for PT Re-Evaluation 08/18/16   Authorization Type private insurance   PT Start Time 3154   PT Stop Time 1137   PT Time Calculation (min) 60 min   Activity Tolerance Patient tolerated treatment well   Behavior During Therapy [facility] for tasks assessed/performed      Past Medical History:  Diagnosis Date  . Allergy   . Dizziness and giddiness 12/24/2012  . GERD (gastroesophageal reflux disease)   . Headache(784.0) 12/24/2012  . Heart murmur   . Obesity   . Reactive airways dysfunction syndrome   . Vitamin D deficiency     Past Surgical History:  Procedure Laterality Date  . ENDOMETRIAL ABLATION    . FOOT SURGERY Left 2006  . TONSILLECTOMY AND ADENOIDECTOMY      There were no vitals filed for this visit.      Subjective Assessment - 08/01/16 1040    Subjective Patient reports she has improved overall and that her pain is 1-2/10 today.    Patient Stated Goals reduce pain   Currently in Pain? Yes   Pain Score 2    Pain Location Neck   Pain Orientation Right   Pain Descriptors / Indicators Sore;Tightness   Pain Type Chronic pain   Pain Radiating Towards none   Pain Onset More than a month ago   Pain Frequency Intermittent   Aggravating Factors  postural   Pain Relieving Factors stetching            [facility] PT Assessment - 08/01/16 0001      Observation/Other Assessments   Neck Disability Index  12%     Posture/Postural Control   Posture Comments patient stands with head in slight R SB and left rotation                     [facility] Adult PT  Treatment/Exercise - 08/01/16 0001      Modalities   Modalities Electrical Stimulation;Moist Heat     Moist Heat Therapy   Number Minutes Moist Heat 15 Minutes   Moist Heat Location Cervical     Electrical Stimulation   Electrical Stimulation Location R cspine and UT premod 80-150 Hz x 15 min   Electrical Stimulation Goals Pain     Manual Therapy   Manual Therapy Soft tissue mobilization;Myofascial release;Passive ROM   Soft tissue mobilization to R SCM and scalenes in supine; to R UT, levator scapula and cervical paraspinals in prone.   Myofascial Release TPR to R scalenes    Passive ROM passive stretch to R UT, Scalenes, levator     Neck Exercises: Stretches   Other Neck Stretches Reviewed UT, levator scapula stretch and added SCM in sitting using LUE assistance          Trigger Point Dry Needling - 08/01/16 1144    Consent Given? Yes   Education Handout Provided No   Muscles Treated Upper Body Sternocleidomastoid;Upper trapezius;Suboccipitals muscle group;Levator scapulae  R; and multifidi at C3-4   Sternocleidomastoid Response Twitch response elicited;Palpable increased muscle length   Upper Trapezius Response Twitch reponse  elicited;Palpable increased muscle length   SubOccipitals Response Twitch response elicited;Palpable increased muscle length   Levator Scapulae Response Twitch response elicited;Palpable increased muscle length              PT Education - 08/01/16 1151    Education provided Yes   Education Details HEP added SCM (same reps and frequency) and educated in MFR/TPR to R SCM and UT (self care)   Person(s) Educated Patient   Methods Explanation;Demonstration;Verbal cues   Comprehension Verbalized understanding;Returned demonstration          PT Short Term Goals - 07/04/16 1016      PT SHORT TERM GOAL #1   Title The patient will be indep with HEP for scapular stabilization, neck stabilization, stretching.   Baseline Target date 08/04/2016    Time 4   Period Weeks     PT SHORT TERM GOAL #2   Title The patient will improve neck AROM rotation to > or equal to 65 degrees bilaterally.   Baseline Target date 08/04/2016   Time 4   Period Weeks     PT SHORT TERM GOAL #3   Title The patient will report pain at rest < or equal to 1/10 to demo improving neck discomfort.   Baseline Target date 08/04/2016   Time 4   Period Weeks     PT SHORT TERM GOAL #4   Title The patient will return demo sleeping positions for home to gain greater comfort at night.   Baseline Target date 08/04/2016   Time 4   Period Weeks           PT Long Term Goals - 08/01/16 1038      PT LONG TERM GOAL #1   Title The patient will reduce NDI from 26% to < or equal to 12% to demo dec'd self perception of neck disability.   Time 6   Period Weeks   Status Achieved     PT LONG TERM GOAL #2   Title The patient will return demo correct body mechanics during work simulation tasks.   Baseline Target date 08/18/2016   Time 6   Period Weeks   Status On-going     PT LONG TERM GOAL #3   Title The patient will be indep with progression of HEP for post d/c strengthening/flexibility.   Baseline Target date  08/18/2016   Status On-going               Plan - 08/01/16 1156    Clinical Impression Statement Patient presented today with her head in slight RSB and left rotation and had tenderness and TPs in her R SCM. She is progressing well toward her LTGs meeting 1/3. She reports no UE sx and intermittent neck pain at this time. She returned to work this week and goals relating to work function have not been met yet. She responded well to treatment today and reported no pain at end of treatment.   Rehab Potential Good   PT Frequency 2x / week   PT Duration 3 weeks   PT Treatment/Interventions ADLs/Self Care Home Management;Cryotherapy;Therapeutic activities;Therapeutic exercise;Neuromuscular re-education;Patient/family education;Passive range of motion;Manual  techniques;Moist Heat;Dry needling   PT Next Visit Plan Assess body mechanics for work; continue scapular strengthening/ chest stretching, postural training, sleep positions, spinal stabilization; soft tissue mobilization neck and parascapular region. Recert sent to cover visits through 08/18/16.   PT Home Exercise Plan added SCM to cervical stretches   Consulted and Agree with Plan of Care  Patient      Patient will benefit from skilled therapeutic intervention in order to improve the following deficits and impairments:  Pain, Decreased range of motion, Increased muscle spasms, Decreased strength, Postural dysfunction, Impaired flexibility, Improper body mechanics  Visit Diagnosis: Neck pain - Plan: PT plan of care cert/re-cert     Problem List Patient Active Problem List   Diagnosis Date Noted  . Cervical strain 06/05/2016  . Headache(784.0) 12/24/2012  . Dizziness and giddiness 12/24/2012  . Back pain 10/14/2012  . GERD (gastroesophageal reflux disease) 08/12/2011  . Vitamin D deficiency   . Heart murmur    Madelyn Flavors PT 08/01/2016, 12:07 PM  [facility] [place], [place], [address] Phone: [phone]   Fax:  [phone]  Name: Ayzia Day MRN: [id number] Date of Birth: 12/10/1970

## 2016-08-05 ENCOUNTER — Encounter: Payer: Self-pay | Admitting: Physical Therapy

## 2016-08-05 ENCOUNTER — Ambulatory Visit: Payer: 59 | Admitting: Physical Therapy

## 2016-08-05 DIAGNOSIS — M542 Cervicalgia: Secondary | ICD-10-CM | POA: Diagnosis not present

## 2016-08-05 DIAGNOSIS — R293 Abnormal posture: Secondary | ICD-10-CM

## 2016-08-05 NOTE — Telephone Encounter (Signed)
I called patient. She is amenable to having a lumbar puncture done, I will get this ordered.

## 2016-08-05 NOTE — Telephone Encounter (Signed)
Patient called to move forward with lumbar puncture.  FYI patient off on Fridays. Please call

## 2016-08-05 NOTE — Addendum Note (Signed)
Addended by: Margette Fast on: 08/05/2016 05:17 PM   Modules accepted: Orders

## 2016-08-07 NOTE — Therapy (Signed)
[facility] [address] [place], [place], [address] Phone: [phone]   Fax:  [phone]  Physical Therapy Treatment  Patient Details  Name: Lonia Sweezey MRN: [id number] Date of Birth: 1970/11/15 Referring Provider: Lenor Coffin, MD  Encounter Date: 08/05/2016   08/05/16 1538  PT Visits / Re-Eval  Visit Number 10  Number of Visits 14  Date for PT Re-Evaluation 08/18/16  Authorization  Authorization Type private insurance  PT Time Calculation  PT Start Time [id number]  PT Stop Time 1615  PT Time Calculation (min) 42 min  PT - End of Session  Activity Tolerance Patient tolerated treatment well  Behavior During Therapy [facility] for tasks assessed/performed     Past Medical History:  Diagnosis Date  . Allergy   . Dizziness and giddiness 12/24/2012  . GERD (gastroesophageal reflux disease)   . Headache(784.0) 12/24/2012  . Heart murmur   . Obesity   . Reactive airways dysfunction syndrome   . Vitamin D deficiency     Past Surgical History:  Procedure Laterality Date  . ENDOMETRIAL ABLATION    . FOOT SURGERY Left 2006  . TONSILLECTOMY AND ADENOIDECTOMY      There were no vitals filed for this visit.     08/05/16 1537  Symptoms/Limitations  Subjective Reports her 2cd dry needling appt went better and she's having less pain today. Pain now with turning head to right only.   Patient Stated Goals reduce pain  Pain Assessment  Currently in Pain? Yes  Pain Score 2  Pain Location Neck  Pain Orientation Right  Pain Descriptors / Indicators Sore  Pain Type Chronic pain  Pain Onset More than a month ago  Pain Frequency Intermittent  Aggravating Factors  turning head toward right  Pain Relieving Factors stretching      08/05/16 1542  Neck Exercises: Machines for Strengthening  UBE (Upper Arm Bike) Level 2.5 x 3 minutes each forward/backwards with cues on posture and scapular stability  Neck Exercises:  Theraband  Scapula Retraction 15 reps;Green;Limitations  Scapula Retraction Limitations cues on correct posture and ex form  Shoulder Extension 15 reps;Green;Limitations  Shoulder Extension Limitations cues on correct posture and ex form  Other Theraband Exercises alternating diagonals x 15 reps each way with cues on form/technique with green band with pt standing with back to door frame to prevent compensatory motions  Horizontal ADduction 10 reps;Green;Limitations  Horizontal ADduction Limitations with pt standing at door frame to prevent compensatory motions, 5 sec holds, cues on ex form and technique.  Neck Exercises: Standing  Other Standing Exercises red pball at wall: rolling ball up wall for flexion stretch for 5 sec holds x 10 reps; push ups x 10 reps with cues on ex form/technique  Neck Exercises: Seated  X to V 10 reps;Limitations  X to V Limitations cues on form and technique  W Back Limitations x 10 reps with scapular retractions, cues on posture and ex form  Shoulder Rolls Backwards;10 reps;Limitations  Shoulder Rolls Limitations cues to slow down and for smaller motions  X to V Weights (lbs) 2  W Back Weights (lbs) with 2# weights  Other Seated Exercise angel wings with 2# hand weights x 10 reps with cues on ex form/technique  Manual Therapy  Manual Therapy Soft tissue mobilization;Myofascial release;Passive ROM  Manual therapy comments pt in hook lying position, all manual therapy for decreased pain and decreaed muscular tightness.  Soft tissue mobilization to bil cervical paraspinals, UT's and scalenes, right >  left, in supine  Myofascial Release to right scalenes, upper traps and STM  Manual Traction cervical manual distraction for 30-40 sec holds x 5 reps; suboccipital release for 30 sec holds x 6 reps.  Neck Exercises: Stretches  Upper Trapezius Stretch 30 seconds;Limitations  Upper Trapezius Stretch Limitations bil sides with cues on technique and hold times  Levator  Stretch 2 reps;30 seconds  Levator Stretch Limitations bil sides with cues on hold time and ex form/technique              PT Short Term Goals - 07/04/16 1016      PT SHORT TERM GOAL #1   Title The patient will be indep with HEP for scapular stabilization, neck stabilization, stretching.   Baseline Target date 08/04/2016   Time 4   Period Weeks     PT SHORT TERM GOAL #2   Title The patient will improve neck AROM rotation to > or equal to 65 degrees bilaterally.   Baseline Target date 08/04/2016   Time 4   Period Weeks     PT SHORT TERM GOAL #3   Title The patient will report pain at rest < or equal to 1/10 to demo improving neck discomfort.   Baseline Target date 08/04/2016   Time 4   Period Weeks     PT SHORT TERM GOAL #4   Title The patient will return demo sleeping positions for home to gain greater comfort at night.   Baseline Target date 08/04/2016   Time 4   Period Weeks           PT Long Term Goals - 08/01/16 1038      PT LONG TERM GOAL #1   Title The patient will reduce NDI from 26% to < or equal to 12% to demo dec'd self perception of neck disability.   Time 6   Period Weeks   Status Achieved     PT LONG TERM GOAL #2   Title The patient will return demo correct body mechanics during work simulation tasks.   Baseline Target date 08/18/2016   Time 6   Period Weeks   Status On-going     PT LONG TERM GOAL #3   Title The patient will be indep with progression of HEP for post d/c strengthening/flexibility.   Baseline Target date  08/18/2016   Status On-going        08/05/16 1538  Plan  Clinical Impression Statement Today's skilled session focused on decreased pain/muscle tightness with manual therapy, followed by postural/cervical strengthening. No issues reported with exercises. green band sent home for pt to continue with strengthening ex's at home. Pt is making steady progress toward goals and should benefit from continued PT to progress toward unmet  goals.                          Pt will benefit from skilled therapeutic intervention in order to improve on the following deficits Pain;Decreased range of motion;Increased muscle spasms;Decreased strength;Postural dysfunction;Impaired flexibility;Improper body mechanics  Rehab Potential Good  PT Frequency 2x / week  PT Duration 3 weeks  PT Treatment/Interventions ADLs/Self Care Home Management;Cryotherapy;Therapeutic activities;Therapeutic exercise;Neuromuscular re-education;Patient/family education;Passive range of motion;Manual techniques;Moist Heat;Dry needling  PT Next Visit Plan Assess body mechanics for work; continue scapular strengthening/ chest stretching, postural training, sleep positions, spinal stabilization; soft tissue mobilization neck and parascapular region. Recert sent to cover visits through 08/18/16.  PT Home Exercise Plan added SCM to cervical  stretches  Consulted and Agree with Plan of Care Patient     Patient will benefit from skilled therapeutic intervention in order to improve the following deficits and impairments:  Pain, Decreased range of motion, Increased muscle spasms, Decreased strength, Postural dysfunction, Impaired flexibility, Improper body mechanics  Visit Diagnosis: Neck pain  Abnormal posture     Problem List Patient Active Problem List   Diagnosis Date Noted  . Cervical strain 06/05/2016  . Headache(784.0) 12/24/2012  . Dizziness and giddiness 12/24/2012  . Back pain 10/14/2012  . GERD (gastroesophageal reflux disease) 08/12/2011  . Vitamin D deficiency   . Heart murmur     [place], [place], [facility] [address], [place], [place] [address] [phone] 08/07/16, 9:40 PM   Name: Cameryn Deleeuw MRN: [id number] Date of Birth: 08/08/1970

## 2016-08-08 ENCOUNTER — Encounter: Payer: Self-pay | Admitting: Physical Therapy

## 2016-08-08 ENCOUNTER — Ambulatory Visit: Payer: 59 | Admitting: Physical Therapy

## 2016-08-08 DIAGNOSIS — M542 Cervicalgia: Secondary | ICD-10-CM

## 2016-08-08 DIAGNOSIS — R293 Abnormal posture: Secondary | ICD-10-CM

## 2016-08-08 NOTE — Therapy (Signed)
[facility] [address] [place], [place], [address] Phone: [phone]   Fax:  [phone]  Physical Therapy Treatment  Patient Details  Name: Ana Gross MRN: [id number] Date of Birth: October 12, 1970 Referring Provider: Lenor Coffin, MD  Encounter Date: 08/08/2016      PT End of Session - 08/08/16 1323    Visit Number 11   Number of Visits 14   Date for PT Re-Evaluation 08/18/16   Authorization Type private insurance   PT Start Time 1027   PT Stop Time 1400   PT Time Calculation (min) 42 min   Activity Tolerance Patient tolerated treatment well   Behavior During Therapy [facility] for tasks assessed/performed      Past Medical History:  Diagnosis Date  . Allergy   . Dizziness and giddiness 12/24/2012  . GERD (gastroesophageal reflux disease)   . Headache(784.0) 12/24/2012  . Heart murmur   . Obesity   . Reactive airways dysfunction syndrome   . Vitamin D deficiency     Past Surgical History:  Procedure Laterality Date  . ENDOMETRIAL ABLATION    . FOOT SURGERY Left 2006  . TONSILLECTOMY AND ADENOIDECTOMY      There were no vitals filed for this visit.      Subjective Assessment - 08/08/16 1320    Subjective Reports feeling good today. No pain, just sorenss/tightness when turning head toward right. Has tried theraband ex's at home without any issues. Used her hand weights (3#) to dupicate ex's performed in session, some when approaching 15 reps. Recommended decreased reps since using higher weights.                                      Patient Stated Goals reduce pain   Currently in Pain? No/denies   Pain Score 0-No pain            [facility] Adult PT Treatment/Exercise - 08/08/16 1324      Neck Exercises: Machines for Strengthening   UBE (Upper Arm Bike) Level 3.0  x 3 minutes each forward/backwards with cues on posture and scapular stability     Neck Exercises: Theraband   Scapula Retraction  Green;Limitations;10 reps   Scapula Retraction Limitations cues on correct posture and ex form   Shoulder Extension Green;Limitations;10 reps   Shoulder Extension Limitations cues on correct posture and ex form   Horizontal ABduction Green;Limitations;10 reps   Horizontal ABduction Limitations cues on correct ex form and posture with pt standing with back to door frame to prevent compensatory motions                     Other Theraband Exercises alternating diagonals x 10 reps each way with cues on form/technique with green band with pt standing with back to door frame to prevent compensatory motions     Neck Exercises: Standing   Wall Push Ups Limitations;15 reps   Wall Push Ups Limitations with red pball, cues on form and technique   Other Standing Exercises red pball at wall: rolling ball up wall for flexion stretch for 5 sec holds x 10 reps with cues on ex form/technique     Neck Exercises: Seated   Neck Retraction 10 reps;5 secs;Limitations   Cervical Rotation 10 reps;Limitations   X to V 10 reps   X to V Weights (lbs) 3   X to V Limitations cues on  form and technique   W Back Weights (lbs) with 3# weights   W Back Limitations x 10 reps with scapular retractions, cues on posture and ex form   Other Seated Exercise angel wings with 3# hand weights x 10 reps with cues on ex form/technique     Neck Exercises: Prone   Shoulder Extension 10 reps;Weights;Limitations   Shoulder Extension Weights (lbs) 2   Shoulder Extension Limitations cues on form and technique   Other Prone Exercise "superman" and "birdman" with 2# hand weights x 10 reps each, cues on form and technique     Neck Exercises: Stretches   Upper Trapezius Stretch Limitations;3 reps;20 seconds   Upper Trapezius Stretch Limitations bil sides with cues on technique and hold times   Levator Stretch 2 reps;20 seconds   Levator Stretch Limitations bil sides with cues on hold time and ex form/technique            PT  Short Term Goals - 08/08/16 1515      PT SHORT TERM GOAL #1   Title The patient will be indep with HEP for scapular stabilization, neck stabilization, stretching.   Baseline 08/08/16:  met with current HEP   Status Achieved     PT SHORT TERM GOAL #2   Title The patient will improve neck AROM rotation to > or equal to 65 degrees bilaterally.   Baseline Target date 08/04/2016   Time 4   Period Weeks   Status On-going     PT SHORT TERM GOAL #3   Title The patient will report pain at rest < or equal to 1/10 to demo improving neck discomfort.   Baseline 08/08/16: met today   Status Achieved     PT SHORT TERM GOAL #4   Title The patient will return demo sleeping positions for home to gain greater comfort at night.   Baseline 08/08/16: pt able to verbalize   Time 4   Period Weeks   Status Achieved           PT Long Term Goals - 08/01/16 1038      PT LONG TERM GOAL #1   Title The patient will reduce NDI from 26% to < or equal to 12% to demo dec'd self perception of neck disability.   Time 6   Period Weeks   Status Achieved     PT LONG TERM GOAL #2   Title The patient will return demo correct body mechanics during work simulation tasks.   Baseline Target date 08/18/2016   Time 6   Period Weeks   Status On-going     PT LONG TERM GOAL #3   Title The patient will be indep with progression of HEP for post d/c strengthening/flexibility.   Baseline Target date  08/18/2016   Status On-going             Plan - 08/08/16 1323    Clinical Impression Statement Pt has met 3/4 STGs today, will check remaining one next session. today's session focused soley on strengthening with stretching performed at end of session. No reports of pain with session. Pt is making great progress toward goals. Pt is now using bands and hand weights with ex's at home without significant issues. Pt should benefit from continued PT to progress toward work related goal.                    Rehab Potential Good    PT Frequency 2x / week  PT Duration 3 weeks   PT Treatment/Interventions ADLs/Self Care Home Management;Cryotherapy;Therapeutic activities;Therapeutic exercise;Neuromuscular re-education;Patient/family education;Passive range of motion;Manual techniques;Moist Heat;Dry needling   PT Next Visit Plan check remaining STG; work simulation activites, advance strengthening as tolerated all in preparation for discharge in next 1-2 sessions   PT [place] added SCM to cervical stretches   Consulted and Agree with Plan of Care Patient      Patient will benefit from skilled therapeutic intervention in order to improve the following deficits and impairments:  Pain, Decreased range of motion, Increased muscle spasms, Decreased strength, Postural dysfunction, Impaired flexibility, Improper body mechanics  Visit Diagnosis: Neck pain  Abnormal posture     Problem List Patient Active Problem List   Diagnosis Date Noted  . Cervical strain 06/05/2016  . Headache(784.0) 12/24/2012  . Dizziness and giddiness 12/24/2012  . Back pain 10/14/2012  . GERD (gastroesophageal reflux disease) 08/12/2011  . Vitamin D deficiency   . Heart murmur     [place], [place], [facility] [address] [place], [place] [address] [phone] 08/08/16, 3:17 PM   Name: Ana Gross MRN: [id number] Date of Birth: 03-17-1971

## 2016-08-13 ENCOUNTER — Ambulatory Visit: Payer: 59 | Admitting: Rehabilitative and Restorative Service Providers"

## 2016-08-13 DIAGNOSIS — M542 Cervicalgia: Secondary | ICD-10-CM | POA: Diagnosis not present

## 2016-08-13 DIAGNOSIS — R293 Abnormal posture: Secondary | ICD-10-CM

## 2016-08-13 NOTE — Therapy (Signed)
[place] [address] [place], [place], [address] Phone: [phone]   Fax:  [phone]  Physical Therapy Treatment  Patient Details  Name: Ana Gross MRN: [id number] Date of Birth: 05/08/1971 Referring Provider: Lenor Coffin, MD  Encounter Date: 08/13/2016      PT End of Session - 08/13/16 2034    Visit Number 12   Number of Visits 14   Date for PT Re-Evaluation 08/18/16   Authorization Type private insurance   PT Start Time 1696   PT Stop Time 1405   PT Time Calculation (min) 41 min   Activity Tolerance Patient tolerated treatment well   Behavior During Therapy [facility] for tasks assessed/performed      Past Medical History:  Diagnosis Date  . Allergy   . Dizziness and giddiness 12/24/2012  . GERD (gastroesophageal reflux disease)   . Headache(784.0) 12/24/2012  . Heart murmur   . Obesity   . Reactive airways dysfunction syndrome   . Vitamin D deficiency     Past Surgical History:  Procedure Laterality Date  . ENDOMETRIAL ABLATION    . FOOT SURGERY Left 2006  . TONSILLECTOMY AND ADENOIDECTOMY      There were no vitals filed for this visit.      Subjective Assessment - 08/13/16 1324    Subjective The patient reports the quota at work has increased and she has been busier than usual.  She is doing UE weights at home and still feels a mild weakness in the left arm.  She does have neck pain on the right side mid cervical spine.     Patient Stated Goals reduce pain   Currently in Pain? Yes   Pain Score 4    Pain Location Neck   Pain Orientation Right   Pain Descriptors / Indicators Sore   Pain Type Chronic pain   Pain Onset More than a month ago   Pain Frequency Intermittent   Aggravating Factors  turning to the right side   Pain Relieving Factors stretching, exercise                         [facility] Adult PT Treatment/Exercise - 08/13/16 2046      Self-Care   Self-Care  (P)  Other Self-Care Comments   Other Self-Care Comments  (P)  Discussed body mechanics and avoiding lifting while twisting and demonstrated with patient return demo'ing      Manual Therapy   Manual Therapy Soft tissue mobilization   Soft tissue mobilization Right cervical musculature/scalenes at C4-C6  region, sidelying left upper trapezius stretch with overpressure and                 PT Education - 08/13/16 2034    Education provided Yes   Education Details Back injury prevention/lifting mechanics (applies to neck as well)--after discussing work set-up and repetitive lifting/twisting/bending tasks   Person(s) Educated Patient   Methods Explanation;Demonstration;Handout   Comprehension Verbalized understanding;Returned demonstration          PT Short Term Goals - 08/13/16 1339      PT SHORT TERM GOAL #1   Title The patient will be indep with HEP for scapular stabilization, neck stabilization, stretching.   Baseline 08/08/16:  met with current HEP   Status Achieved     PT SHORT TERM GOAL #2   Title The patient will improve neck AROM rotation to > or equal to 65 degrees bilaterally.  Baseline Met on 08/13/2016   Time 4   Period Weeks   Status Achieved     PT SHORT TERM GOAL #3   Title The patient will report pain at rest < or equal to 1/10 to demo improving neck discomfort.   Baseline 08/08/16: met today   Status Achieved     PT SHORT TERM GOAL #4   Title The patient will return demo sleeping positions for home to gain greater comfort at night.   Baseline 08/08/16: pt able to verbalize   Time 4   Period Weeks   Status Achieved           PT Long Term Goals - 08/13/16 2036      PT LONG TERM GOAL #1   Title The patient will reduce NDI from 26% to < or equal to 12% to demo dec'd self perception of neck disability.   Time 6   Period Weeks   Status Achieved     PT LONG TERM GOAL #2   Title The patient will return demo correct body mechanics during work  simulation tasks.   Baseline Met on 08/13/2016   Time 6   Period Weeks   Status Achieved     PT LONG TERM GOAL #3   Title The patient will be indep with progression of HEP for post d/c strengthening/flexibility.   Baseline Target date  08/18/2016   Status On-going               Plan - 08/13/16 2036    Clinical Impression Statement The patient has met all STGs and 2 LTGs.  PT to check HEP next week and plans to d/c.  May  need further progression of HEP, if indicated.  Discussed work simulation tasks today as patient had pain (possibly due to increased work productivity) this week.     PT Treatment/Interventions ADLs/Self Care Home Management;Cryotherapy;Therapeutic activities;Therapeutic exercise;Neuromuscular re-education;Patient/family education;Passive range of motion;Manual techniques;Moist Heat;Dry needling   PT Next Visit Plan Advance strengthening as tolerated in preparation for d/c   Consulted and Agree with Plan of Care Patient      Patient will benefit from skilled therapeutic intervention in order to improve the following deficits and impairments:  Pain, Decreased range of motion, Increased muscle spasms, Decreased strength, Postural dysfunction, Impaired flexibility, Improper body mechanics  Visit Diagnosis: Neck pain  Abnormal posture     Problem List Patient Active Problem List   Diagnosis Date Noted  . Cervical strain 06/05/2016  . Headache(784.0) 12/24/2012  . Dizziness and giddiness 12/24/2012  . Back pain 10/14/2012  . GERD (gastroesophageal reflux disease) 08/12/2011  . Vitamin D deficiency   . Heart murmur     Jaquelynn Wanamaker, PT 08/13/2016, 9:42 PM  [place] [address] [place], [place], [address] Phone: [phone]   Fax:  [phone]  Name: Ana Gross MRN: [id number] Date of Birth: Nov 20, 1970

## 2016-08-13 NOTE — Patient Instructions (Signed)
Back Injury Prevention Introduction Back injuries can be very painful. They can also be difficult to heal. After having one back injury, you are more likely to injure your back again. It is important to learn how to avoid injuring or re-injuring your back. The following tips can help you to prevent a back injury. What should I know about physical fitness?  Exercise for 30 minutes per day on most days of the week or as told by your doctor. Make sure to:  Do aerobic exercises, such as walking, jogging, biking, or swimming.  Do exercises that increase balance and strength, such as tai chi and yoga.  Do stretching exercises. This helps with flexibility.  Try to develop strong belly (abdominal) muscles. Your belly muscles help to support your back.  Stay at a healthy weight. This helps to decrease your risk of a back injury. What should I know about my diet?  Talk with your doctor about your overall diet. Take supplements and vitamins only as told by your doctor.  Talk with your doctor about how much calcium and vitamin D you need each day. These nutrients help to prevent weakening of the bones (osteoporosis).  Include good sources of calcium in your diet, such as:  Dairy products.  Green leafy vegetables.  Products that have had calcium added to them (fortified).  Include good sources of vitamin D in your diet, such as:  Milk.  Foods that have had vitamin D added to them. What should I know about my posture?  Sit up straight and stand up straight. Avoid leaning forward when you sit or hunching over when you stand.  Choose chairs that have good low-back (lumbar) support.  If you work at a desk, sit close to it so you do not need to lean over. Keep your chin tucked in. Keep your neck drawn back. Keep your elbows bent so your arms look like the letter "L" (right angle).  Sit high and close to the steering wheel when you drive. Add a low-back support to your car seat, if  needed.  Avoid sitting or standing in one position for very long. Take breaks to get up, stretch, and walk around at least one time every hour. Take breaks every hour if you are driving for long periods of time.  Sleep on your side with your knees slightly bent, or sleep on your back with a pillow under your knees. Do not lie on the front of your body to sleep. What should I know about lifting, twisting, and reaching? Lifting and Heavy Lifting   Avoid heavy lifting, especially lifting over and over again. If you must do heavy lifting:  Stretch before lifting.  Work slowly.  Rest between lifts.  Use a tool such as a cart or a dolly to move objects if one is available.  Make several small trips instead of carrying one heavy load.  Ask for help when you need it, especially when moving big objects.  Follow these steps when lifting:  Stand with your feet shoulder-width apart.  Get as close to the object as you can. Do not pick up a heavy object that is far from your body.  Use handles or lifting straps if they are available.  Bend at your knees. Squat down, but keep your heels off the floor.  Keep your shoulders back. Keep your chin tucked in. Keep your back straight.  Lift the object slowly while you tighten the muscles in your legs, belly, and butt. Keep  the object as close to the center of your body as possible.  Follow these steps when putting down a heavy load:  Stand with your feet shoulder-width apart.  Lower the object slowly while you tighten the muscles in your legs, belly, and butt. Keep the object as close to the center of your body as possible.  Keep your shoulders back. Keep your chin tucked in. Keep your back straight.  Bend at your knees. Squat down, but keep your heels off the floor.  Use handles or lifting straps if they are available. Twisting and Reaching  Avoid lifting heavy objects above your waist.  Do not twist at your waist while you are lifting  or carrying a load. If you need to turn, move your feet.  Do not bend over without bending at your knees.  Avoid reaching over your head, across a table, or for an object on a high surface. What are some other tips?  Avoid wet floors and icy ground. Keep sidewalks clear of ice to prevent falls.  Do not sleep on a mattress that is too soft or too hard.  Keep items that you use often within easy reach.  Put heavier objects on shelves at waist level, and put lighter objects on lower or higher shelves.  Find ways to lower your stress, such as:  Exercise.  Massage.  Relaxation techniques.  Talk with your doctor if you feel anxious or depressed. These conditions can make back pain worse.  Wear flat heel shoes with cushioned soles.  Avoid making quick (sudden) movements.  Use both shoulder straps when carrying a backpack.  Do not use any tobacco products, including cigarettes, chewing tobacco, or electronic cigarettes. If you need help quitting, ask your doctor. This information is not intended to replace advice given to you by your health care provider. Make sure you discuss any questions you have with your health care provider. Document Released: 12/24/2007 Document Revised: 12/13/2015 Document Reviewed: 07/11/2014  2017 Elsevier

## 2016-08-15 ENCOUNTER — Ambulatory Visit: Payer: Self-pay | Admitting: Physical Therapy

## 2016-08-15 ENCOUNTER — Ambulatory Visit
Admission: RE | Admit: 2016-08-15 | Discharge: 2016-08-15 | Disposition: A | Discharge status: Home / Self Care | Payer: 59 | Source: Ambulatory Visit | Attending: Neurology | Admitting: Neurology

## 2016-08-15 VITALS — BP 121/67 | HR 61

## 2016-08-15 DIAGNOSIS — R9089 Other abnormal findings on diagnostic imaging of central nervous system: Secondary | ICD-10-CM

## 2016-08-15 DIAGNOSIS — R9082 White matter disease, unspecified: Secondary | ICD-10-CM | POA: Diagnosis not present

## 2016-08-15 LAB — CSF CELL COUNT WITH DIFFERENTIAL
RBC COUNT CSF: 0 {cells}/uL (ref 0–10)
WBC CSF: 1 {cells}/uL (ref 0–5)

## 2016-08-15 LAB — GLUCOSE, CSF: Glucose, CSF: 59 mg/dL (ref 43–76)

## 2016-08-15 LAB — PROTEIN, CSF: Total Protein, CSF: 34 mg/dL (ref 15–45)

## 2016-08-15 NOTE — Progress Notes (Signed)
One SST tube of blood drawn from left [facility] space for LP labs; site unremarkable.  jkl

## 2016-08-15 NOTE — Discharge Instructions (Signed)

## 2016-08-18 LAB — ANGIOTENSIN CONVERTING ENZYME, CSF: ACE, CSF: 4 U/L (ref <=15)

## 2016-08-18 LAB — VDRL, CSF: SYPHILIS VDRL QUANT CSF: NONREACTIVE

## 2016-08-19 ENCOUNTER — Telehealth: Payer: Worker's Compensation | Admitting: Nurse Practitioner

## 2016-08-19 DIAGNOSIS — J0100 Acute maxillary sinusitis, unspecified: Secondary | ICD-10-CM

## 2016-08-19 MED ORDER — AMOXICILLIN-POT CLAVULANATE 875-125 MG PO TABS: 1.0000 | ORAL_TABLET | Freq: Two times a day (BID) | ORAL | 0 refills | Status: DC

## 2016-08-19 NOTE — Progress Notes (Signed)

## 2016-08-21 ENCOUNTER — Telehealth: Payer: Self-pay | Admitting: Neurology

## 2016-08-21 LAB — B. BURGDORFI ANTIBODIES, CSF: LYME AB: NEGATIVE

## 2016-08-21 LAB — OLIGOCLONAL BANDS, CSF + SERM

## 2016-08-21 NOTE — Telephone Encounter (Signed)
I called patient. Spinal fluid analysis is unremarkable, no evidence of oligoclonal banding suggestion of demyelinating disease. The patient is to go on low-dose aspirin, 81 mg daily. The patient will follow-up on 09/03/2016.

## 2016-08-22 ENCOUNTER — Encounter: Payer: Self-pay | Admitting: Rehabilitative and Restorative Service Providers"

## 2016-08-22 ENCOUNTER — Ambulatory Visit: Payer: 59 | Attending: Neurology | Admitting: Rehabilitative and Restorative Service Providers"

## 2016-08-22 DIAGNOSIS — M542 Cervicalgia: Secondary | ICD-10-CM | POA: Diagnosis not present

## 2016-08-22 DIAGNOSIS — R293 Abnormal posture: Secondary | ICD-10-CM

## 2016-08-22 NOTE — Therapy (Signed)
[facility] [address] [place], [place], [address] Phone: [phone]   Fax:  [phone]  Physical Therapy Treatment  Patient Details  Name: Ana Gross MRN: [id number] Date of Birth: 21-May-1971 Referring Provider: Lenor Coffin, MD  Encounter Date: 08/22/2016      PT End of Session - 08/22/16 1223    Visit Number 13   Number of Visits 14   Date for PT Re-Evaluation 08/18/16   Authorization Type private insurance   PT Start Time 1020   PT Stop Time 1100   PT Time Calculation (min) 40 min   Activity Tolerance Patient tolerated treatment well   Behavior During Therapy [facility] for tasks assessed/performed      Past Medical History:  Diagnosis Date  . Allergy   . Dizziness and giddiness 12/24/2012  . GERD (gastroesophageal reflux disease)   . Headache(784.0) 12/24/2012  . Heart murmur   . Obesity   . Reactive airways dysfunction syndrome   . Vitamin D deficiency     Past Surgical History:  Procedure Laterality Date  . ENDOMETRIAL ABLATION    . FOOT SURGERY Left 2006  . TONSILLECTOMY AND ADENOIDECTOMY      There were no vitals filed for this visit.      Subjective Assessment - 08/22/16 1020    Subjective The patient notes a ittle neck stiffness, but increasing back pain after the lumbar puncture.  She has been on a different machine this week and notes different mechanics.    Patient Stated Goals reduce pain   Currently in Pain? Yes  neck "just a little stiff"                         [place] Adult PT Treatment/Exercise - 08/22/16 1158      Exercises   Exercises Other Exercises   Other Exercises  Reviewed prior home activities and provided mild correction for shoulder positioning and which  muscles to emphasize fo roptimal mechanics.  PT provided written handout--see patient instructions.                 PT Education - 08/22/16 1048    Education provided Yes   Education Details HEP: added images of prior HEP   Person(s) Educated Patient   Methods Explanation;Demonstration;Handout   Comprehension Verbalized understanding;Returned demonstration          PT Short Term Goals - 08/13/16 1339      PT SHORT TERM GOAL #1   Title The patient will be indep with HEP for scapular stabilization, neck stabilization, stretching.   Baseline 08/08/16:  met with current HEP   Status Achieved     PT SHORT TERM GOAL #2   Title The patient will improve neck AROM rotation to > or equal to 65 degrees bilaterally.   Baseline Met on 08/13/2016   Time 4   Period Weeks   Status Achieved     PT SHORT TERM GOAL #3   Title The patient will report pain at rest < or equal to 1/10 to demo improving neck discomfort.   Baseline 08/08/16: met today   Status Achieved     PT SHORT TERM GOAL #4   Title The patient will return demo sleeping positions for home to gain greater comfort at night.   Baseline 08/08/16: pt able to verbalize   Time 4   Period Weeks   Status Achieved  PT Long Term Goals - 08/22/16 1102      PT LONG TERM GOAL #1   Title The patient will reduce NDI from 26% to < or equal to 12% to demo dec'd self perception of neck disability.   Time 6   Period Weeks   Status Achieved     PT LONG TERM GOAL #2   Title The patient will return demo correct body mechanics during work simulation tasks.   Baseline Met on 08/13/2016   Time 6   Period Weeks   Status Achieved     PT LONG TERM GOAL #3   Title The patient will be indep with progression of HEP for post d/c strengthening/flexibility.   Baseline Independent with HEP 08/22/2016   Status Achieved               Plan - 08/22/16 1223    Clinical Impression Statement The patient has met STGs and LTGs.  She has comprehensive HEP for stretching, postural stabilization and strengthening.  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  Patient is able to demonstrate improved mechanics.  Discussed ongoing exercise as injury prevention due to nature of work setting.     PT Next Visit Plan Discharge today   Consulted and Agree with Plan of Care Patient      Patient will benefit from skilled therapeutic intervention in order to improve the following deficits and impairments:     Visit Diagnosis: Neck pain  Abnormal posture     Problem List Patient Active Problem List   Diagnosis Date Noted  . Cervical strain 06/05/2016  . Headache(784.0) 12/24/2012  . Dizziness and giddiness 12/24/2012  . Back pain 10/14/2012  . GERD (gastroesophageal reflux disease) 08/12/2011  . Vitamin D deficiency   . Heart murmur     Advay Volante, PT 08/22/2016, 12:26 PM  Ana Gross [address] [place], [place], [address] Phone: [phone]   Fax:  [phone]  Name: [name] MRN: [id number] Date of Birth: June 10, 1971

## 2016-08-22 NOTE — Patient Instructions (Addendum)
  Shoulder Extension    Band can be at shoulder or waist height.  Thumbs should point straight ahead. While holding an elastic band in front of you with your elbows straight, pull the band down and back towards your side.  Repeat 10-15 times for 2 sets.  Perform 2 times per day.  Shoulder Retraction    Standing--Holding elastic band with both hands, draw back the band as you bend your elbows. Keep your elbows near the side of your body. Squeeze shoulder blades together.  Hold 3 seconds.  Repeat 10-15 times for 2 sets.  Perform 2 times per day.             Horizontal Abduction (Resistive Band)    With arms at shoulder level, keep elbows straight. Using other arm as anchor, pull involved arm outward. BOTH ARMS PULL OUT AT THE SAME TIME *THUMBS POINT BEHIND YOU* 10-15 times, 2 times/day. Copyright  VHI. All rights reserved.   "W" exercise - let thumbs point to the back of the room  X to V exercise   Scapular Retraction: Abduction / Extension (Prone)  Lie with arms out from sides 90. Pinch shoulder blades together and raise arms a few inches from floor. Repeat __10-15__ times per set. Do __2__ sets per session. Do _2___ sessions per day. Can use weights *thumbs can point to the ceiling.  Scapular: Flexion (Prone)  Holding 2 pound weights, raise both arms forward. Keep elbows straight. Repeat __10-15__ times per set. Do __2__ sets per session. Do _2___ sessions per day.  Copyright  VHI. All rights reserved.  Scapular Retraction (Prone)    Lie with arms at sides. Pinch shoulder blades together and raise arms a few inches from floor.  Can hold 2 pound weights-- think about reaching arms towards feet. Repeat _10-15___ times per set. Do ___2_ sets per session. Do __2__ sessions per day.  http://orth.exer.us/955   Copyright  VHI. All rights reserved.   Yoga with Vincente Liberty core workout or beginner

## 2016-08-22 NOTE — Therapy (Signed)
[facility] [address] [place], [place], [address] Phone: [phone]   Fax:  [phone]  Patient Details  Name: Ana Gross MRN: [id number] Date of Birth: [birth year] Referring Provider:  No ref. provider found  Encounter Date: 08/22/2016  PHYSICAL THERAPY DISCHARGE SUMMARY  Visits from Start of Care: 13  Current functional level related to goals / functional outcomes:     PT Short Term Goals - 08/13/16 1339      PT SHORT TERM GOAL #1   Title The patient will be indep with HEP for scapular stabilization, neck stabilization, stretching.   Baseline 08/08/16:  met with current HEP   Status Achieved     PT SHORT TERM GOAL #2   Title The patient will improve neck AROM rotation to > or equal to 65 degrees bilaterally.   Baseline Met on 08/13/2016   Time 4   Period Weeks   Status Achieved     PT SHORT TERM GOAL #3   Title The patient will report pain at rest < or equal to 1/10 to demo improving neck discomfort.   Baseline 08/08/16: met today   Status Achieved     PT SHORT TERM GOAL #4   Title The patient will return demo sleeping positions for home to gain greater comfort at night.   Baseline 08/08/16: pt able to verbalize   Time 4   Period Weeks   Status Achieved         PT Long Term Goals - 08/22/16 1102      PT LONG TERM GOAL #1   Title The patient will reduce NDI from 26% to < or equal to 12% to demo dec'd self perception of neck disability.   Time 6   Period Weeks   Status Achieved     PT LONG TERM GOAL #2   Title The patient will return demo correct body mechanics during work simulation tasks.   Baseline Met on 08/13/2016   Time 6   Period Weeks   Status Achieved     PT LONG TERM GOAL #3   Title The patient will be indep with progression of HEP for post d/c strengthening/flexibility.   Baseline Independent with HEP 08/22/2016   Status Achieved        Remaining deficits: Intermittent  stiffness improved with HEP   Education / Equipment: HEP, community wellness, Economist, work tasks.   Plan: Patient agrees to discharge.  Patient goals were met. Patient is being discharged due to meeting the stated rehab goals.  ?????        Thank you for the referral of this patient. Rudell Cobb, MPT  Evelena Masci 08/22/2016, 2:40 PM  [facility] [address] [place], [place], [address] Phone: [phone]   Fax:  [phone]

## 2016-09-03 ENCOUNTER — Ambulatory Visit (INDEPENDENT_AMBULATORY_CARE_PROVIDER_SITE_OTHER): Payer: 59 | Admitting: Adult Health

## 2016-09-03 ENCOUNTER — Encounter: Payer: Self-pay | Admitting: Adult Health

## 2016-09-03 VITALS — BP 115/78 | HR 77 | Ht 59.0 in | Wt 159.4 lb

## 2016-09-03 DIAGNOSIS — M542 Cervicalgia: Secondary | ICD-10-CM

## 2016-09-03 DIAGNOSIS — R519 Headache, unspecified: Secondary | ICD-10-CM

## 2016-09-03 DIAGNOSIS — R51 Headache: Secondary | ICD-10-CM

## 2016-09-03 NOTE — Progress Notes (Signed)
I have read the note, and I agree with the clinical assessment and plan.  Robin Petrakis KEITH   

## 2016-09-03 NOTE — Progress Notes (Signed)
PATIENT: Ana Gross DOB: September 10, 1970  REASON FOR VISIT: follow up- headache, neck pain HISTORY FROM: patient  HISTORY OF PRESENT ILLNESS: Ana Gross is a 46 year old female with a history of headaches and neck pain. She returns today for follow-up. She reports that her headaches are well controlled. She states that she has approximately 2 severe headaches a year. Her neck pain has improved with neuromuscular therapy. She states that her neck is stiff when she wakes up in the morning but she does the stretching exercises that they taught her and it seems to improve. She states she has noticed here recently she is sweating more at night and during the day. She plans to follow-up with her primary care.  HISTORY 05/26/16:  Ana Gross is a 46 year old right-handed white female with a history of headaches and neck pain. The patient was seen here over 3 years ago for headaches, MRI of the brain was done and showed evidence of multiple white matter lesions, MRI of the cervical spine was done at that time and appeared to be unremarkable, no spinal cord lesions were seen. A visual evoked response test was done and was normal. The patient was lost to follow-up. Over time, the patient indicates that her headaches have become much less frequent, she may have one headache every 2 or 3 months. More recently, over the last 6-8 months she has had some right-sided neck discomfort going down into the upper shoulder. When the neck pain is severe, she may have some tingling sensations in the fourth and fifth fingers on the right hand. She denies true weakness of extremities. The patient does report some occasional blurred vision, she denies any balance problems or difficulty controlling the bowels or the bladder. She denies memory problems, dizziness, or confusion. The patient has undergone MRI of the cervical spine that shows a mild disc bulge at C5-6 level, no evidence of spinal cord compression or nerve root  compression. The patient is sent to this office for an evaluation.  REVIEW OF SYSTEMS: Out of a complete 14 system review of symptoms, the patient complains only of the following symptoms, and all other reviewed systems are negative.  Frequent waking, snoring, excessive sweating  ALLERGIES: Allergies  Allergen Reactions  . Iodinated Diagnostic Agents Swelling    Makes eyes swell up  . Cefzil [Cefprozil] Other (See Comments)    Stomach pain  . Singulair [Montelukast Sodium] Other (See Comments)    dizzy    HOME MEDICATIONS: Outpatient Medications Prior to Visit  Medication Sig Dispense Refill  . Acetaminophen (TYLENOL PO) Take by mouth. As needed    . albuterol (PROVENTIL HFA;VENTOLIN HFA) 108 (90 Base) MCG/ACT inhaler Inhale 2 puffs into the lungs every 4 (four) hours as needed. 1 Inhaler 0  . Aspirin-Acetaminophen-Caffeine (GOODY HEADACHE PO) Take 1 each by mouth as needed.    Marland Kitchen BIOTIN PO Take 1,000 mcg by mouth daily.    . Calcium Carbonate-Vitamin D (CALTRATE 600+D PO) Take by mouth daily. 600 D-3    . carboxymethylcellulose (REFRESH PLUS) 0.5 % SOLN 1 drop 3 (three) times daily as needed.    . Cyanocobalamin (VITAMIN B 12 PO) Take 1,000 mcg by mouth daily. Isagenix brand    . Evening Primrose Oil 1000 MG CAPS Take by mouth daily.     . fexofenadine (ALLEGRA) 180 MG tablet Take 180 mg by mouth daily as needed.    . Fish Oil OIL Take by mouth.    . Probiotic Product (PROBIOTIC PEARLS  PO) Take by mouth daily.     . sucralfate (CARAFATE) 1 GM/10ML suspension TAKE 2 TEASPOONSFUL BY MOUTH 4 TIMES DAILY.prn    . Triamcinolone Acetonide (NASACORT ALLERGY 24HR NA) Place into the nose daily.    . TURMERIC PO Take by mouth.    Marland Kitchen UNABLE TO FIND Med Name: Isagenix shakes and greens    . vitamin C (ASCORBIC ACID) 500 MG tablet Take 500 mg by mouth daily.    . vitamin E 200 UNIT capsule Take 200 Units by mouth daily.    Marland Kitchen amoxicillin-clavulanate (AUGMENTIN) 875-125 MG tablet Take 1 tablet  by mouth 2 (two) times daily. (Patient not taking: Reported on 09/03/2016) 20 tablet 0  . gabapentin (NEURONTIN) 100 MG capsule Take 1 capsule (100 mg total) by mouth 3 (three) times daily. (Patient not taking: Reported on 07/04/2016) 180 capsule 2  . meloxicam (MOBIC) 15 MG tablet Take 1 tablet (15 mg total) by mouth daily. (Patient not taking: Reported on 09/03/2016) 30 tablet 1   No facility-administered medications prior to visit.     PAST MEDICAL HISTORY: Past Medical History:  Diagnosis Date  . Allergy   . Dizziness and giddiness 12/24/2012  . GERD (gastroesophageal reflux disease)   . Headache(784.0) 12/24/2012  . Heart murmur   . Obesity   . Reactive airways dysfunction syndrome   . Vitamin D deficiency     PAST SURGICAL HISTORY: Past Surgical History:  Procedure Laterality Date  . ENDOMETRIAL ABLATION    . FOOT SURGERY Left 2006  . TONSILLECTOMY AND ADENOIDECTOMY      FAMILY HISTORY: Family History  Problem Relation Age of Onset  . COPD Mother   . Diabetes Mother   . Diabetes Father   . Hypertension Father   . Coronary artery disease Father   . COPD Father   . Arthritis Father     SOCIAL HISTORY: Social History   Social History  . Marital status: Married    Spouse name: N/A  . Number of children: 2  . Years of education: 14   Occupational History  .  [employer]   Social History Main Topics  . Smoking status: Never Smoker  . Smokeless tobacco: Never Used  . Alcohol use Yes     Comment: she drinks about once or twice a year.  . Drug use: No  . Sexual activity: Yes    Partners: Male     Comment: Married   Other Topics Concern  . Not on file   Social History Narrative   Lives at home   Right-handed   Caffeine: drinks a glass of unsweet tea each day      PHYSICAL EXAM  Vitals:   09/03/16 1519  BP: 115/78  Pulse: 77  Weight: 159 lb 6.4 oz (72.3 kg)  Height: 4\' 11"  (1.499 m)   Body mass index is 32.19 kg/m.  Generalized: Well  developed, in no acute distress   Neurological examination  Mentation: Alert oriented to time, place, history taking. Follows all commands speech and language fluent Cranial nerve II-XII: Pupils were equal round reactive to light. Extraocular movements were full, visual field were full on confrontational test. Facial sensation and strength were normal. Uvula tongue midline. Head turning and shoulder shrug  were normal and symmetric. Motor: The motor testing reveals 5 over 5 strength of all 4 extremities. Good symmetric motor tone is noted throughout.  Sensory: Sensory testing is intact to soft touch on all 4 extremities. No evidence of extinction  is noted.  Coordination: Cerebellar testing reveals good finger-nose-finger and heel-to-shin bilaterally.  Gait and station: Gait is normal. Tandem gait is normal. Romberg is negative. No drift is seen.  Reflexes: Deep tendon reflexes are symmetric and normal bilaterally.   DIAGNOSTIC DATA (LABS, IMAGING, TESTING) - I reviewed patient records, labs, notes, testing and imaging myself where available.  Lab Results  Component Value Date   WBC 5.0 08/19/2014   HGB 13.4 08/19/2014   HCT 40.5 08/19/2014   MCV 90.8 08/19/2014   PLT 259 08/19/2014      Component Value Date/Time   NA 140 08/19/2014 0954   K 4.5 08/19/2014 0954   CL 103 08/19/2014 0954   CO2 31 08/19/2014 0954   GLUCOSE 84 08/19/2014 0954   BUN 14 08/19/2014 0954   CREATININE 0.69 08/19/2014 0954   CALCIUM 9.5 08/19/2014 0954   Lab Results  Component Value Date   CHOL 188 08/19/2014   HDL 57 08/19/2014   LDLCALC 112 (H) 08/19/2014   TRIG 96 08/19/2014   CHOLHDL 3.3 08/19/2014      ASSESSMENT AND PLAN 46 y.o. year old female  has a past medical history of Allergy; Dizziness and giddiness (12/24/2012); GERD (gastroesophageal reflux disease); [id number]) (12/24/2012); Heart murmur; Obesity; Reactive airways dysfunction syndrome; and Vitamin D deficiency. here with:  1.  Headaches 2. Neck pain  Patient's headaches are under good control without medication. Her neck pain has also improved with neuromuscular there. She is advised to continue the stretching exercises. Advised that if her neck pain worsens we can consider trying a low-dose muscle relaxer at bedtime. Patient voices understanding. She will follow up with our office on an as-needed basis.    Ward Givens, MSN, NP-C 09/03/2016, 3:34 PM [facility] [address], [place], [place] [address] [phone]

## 2016-09-03 NOTE — Patient Instructions (Signed)
If your symptoms worsen or you develop new symptoms please let us know.  Can try low dose muscle relaxer if neck pain doesn't get better

## 2016-09-12 DIAGNOSIS — L749 Eccrine sweat disorder, unspecified: Secondary | ICD-10-CM | POA: Diagnosis not present

## 2016-09-12 DIAGNOSIS — Z01419 Encounter for gynecological examination (general) (routine) without abnormal findings: Secondary | ICD-10-CM | POA: Diagnosis not present

## 2016-09-12 DIAGNOSIS — Z131 Encounter for screening for diabetes mellitus: Secondary | ICD-10-CM | POA: Diagnosis not present

## 2016-09-15 DIAGNOSIS — N951 Menopausal and female climacteric states: Secondary | ICD-10-CM | POA: Diagnosis not present

## 2016-09-19 ENCOUNTER — Encounter: Payer: Self-pay | Admitting: Family Medicine

## 2016-09-19 DIAGNOSIS — Z1231 Encounter for screening mammogram for malignant neoplasm of breast: Secondary | ICD-10-CM | POA: Diagnosis not present

## 2016-09-23 ENCOUNTER — Telehealth: Payer: Self-pay | Admitting: Family Medicine

## 2016-09-23 NOTE — Telephone Encounter (Signed)
Not in my folder plz find and bring to me

## 2016-09-23 NOTE — Telephone Encounter (Signed)
Review mammogram results from [facility] in results folder.

## 2016-10-01 ENCOUNTER — Encounter: Payer: Self-pay | Admitting: Family Medicine

## 2016-10-01 ENCOUNTER — Telehealth: Payer: Self-pay | Admitting: Family Medicine

## 2016-10-01 DIAGNOSIS — N6489 Other specified disorders of breast: Secondary | ICD-10-CM | POA: Diagnosis not present

## 2016-10-01 DIAGNOSIS — N6321 Unspecified lump in the left breast, upper outer quadrant: Secondary | ICD-10-CM | POA: Diagnosis not present

## 2016-10-01 DIAGNOSIS — N6322 Unspecified lump in the left breast, upper inner quadrant: Secondary | ICD-10-CM | POA: Diagnosis not present

## 2016-10-01 DIAGNOSIS — N6002 Solitary cyst of left breast: Secondary | ICD-10-CM | POA: Diagnosis not present

## 2016-10-01 NOTE — Telephone Encounter (Signed)
Review mammogram results in results folder. °

## 2016-11-13 DIAGNOSIS — S336XXA Sprain of sacroiliac joint, initial encounter: Secondary | ICD-10-CM | POA: Diagnosis not present

## 2016-11-13 DIAGNOSIS — S134XXA Sprain of ligaments of cervical spine, initial encounter: Secondary | ICD-10-CM | POA: Diagnosis not present

## 2016-11-13 DIAGNOSIS — M546 Pain in thoracic spine: Secondary | ICD-10-CM | POA: Diagnosis not present

## 2017-01-14 DIAGNOSIS — S336XXA Sprain of sacroiliac joint, initial encounter: Secondary | ICD-10-CM | POA: Diagnosis not present

## 2017-01-14 DIAGNOSIS — S134XXA Sprain of ligaments of cervical spine, initial encounter: Secondary | ICD-10-CM | POA: Diagnosis not present

## 2017-01-14 DIAGNOSIS — M546 Pain in thoracic spine: Secondary | ICD-10-CM | POA: Diagnosis not present

## 2017-01-23 ENCOUNTER — Encounter (INDEPENDENT_AMBULATORY_CARE_PROVIDER_SITE_OTHER): Payer: Self-pay | Admitting: Internal Medicine

## 2017-03-30 DIAGNOSIS — S134XXA Sprain of ligaments of cervical spine, initial encounter: Secondary | ICD-10-CM | POA: Diagnosis not present

## 2017-03-30 DIAGNOSIS — M546 Pain in thoracic spine: Secondary | ICD-10-CM | POA: Diagnosis not present

## 2017-03-30 DIAGNOSIS — S336XXA Sprain of sacroiliac joint, initial encounter: Secondary | ICD-10-CM | POA: Diagnosis not present

## 2017-04-07 ENCOUNTER — Telehealth: Payer: Self-pay | Admitting: *Deleted

## 2017-04-07 ENCOUNTER — Other Ambulatory Visit: Payer: Self-pay | Admitting: *Deleted

## 2017-04-07 NOTE — Telephone Encounter (Signed)
[facility] calling to get order for breast ultrasound. Pt is scheduled for tomorrow. They have her down for breast ultrasound. Pt had diagnostic mammo of left breast in march for left breast asymmetry. Need order faxed for left breast ultrasound. Order is written at nurse's station if you agree with order please sign. Needs to be faxed today. Apt tomorrow.  Fax # [phone].

## 2017-04-08 ENCOUNTER — Telehealth: Payer: Self-pay | Admitting: Family Medicine

## 2017-04-08 DIAGNOSIS — N6012 Diffuse cystic mastopathy of left breast: Secondary | ICD-10-CM | POA: Diagnosis not present

## 2017-04-08 DIAGNOSIS — Z0289 Encounter for other administrative examinations: Secondary | ICD-10-CM

## 2017-04-08 DIAGNOSIS — N6489 Other specified disorders of breast: Secondary | ICD-10-CM | POA: Diagnosis not present

## 2017-04-08 NOTE — Telephone Encounter (Signed)
For Ana Gross-patient dropped off FMLA to be done about her abnormal mammograms. Im not sure what to do with this one can you help out results are in the system under media

## 2017-04-13 NOTE — Telephone Encounter (Signed)
done

## 2017-04-22 ENCOUNTER — Ambulatory Visit (INDEPENDENT_AMBULATORY_CARE_PROVIDER_SITE_OTHER): Payer: Self-pay | Admitting: Internal Medicine

## 2017-04-29 IMAGING — DX DG CERVICAL SPINE COMPLETE 4+V
5 series · 5 of 5 positions shown · non-contrast
Comparison: Paranasal sinus CT 09/20/2013.

CLINICAL DATA: 45-year-old female with cervical neck pain radiating
to the shoulders for several months with no known it tree. Initial
encounter.

EXAM:
CERVICAL SPINE - COMPLETE 4+ VIEW

[c-spine lat]
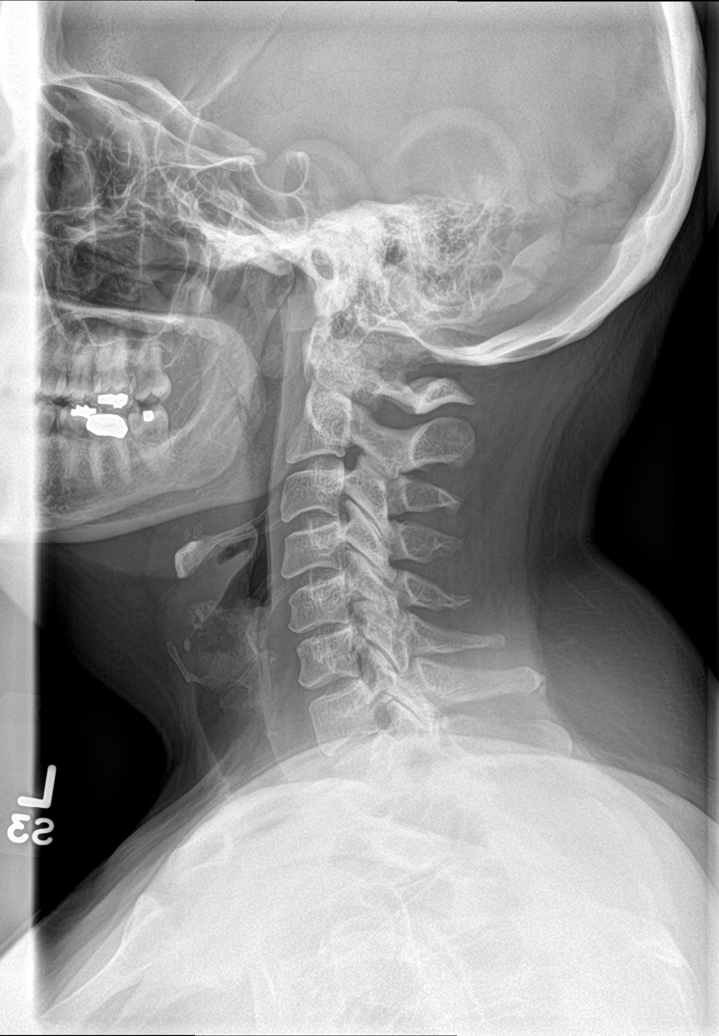

[c-spine obl (1 of 2)]
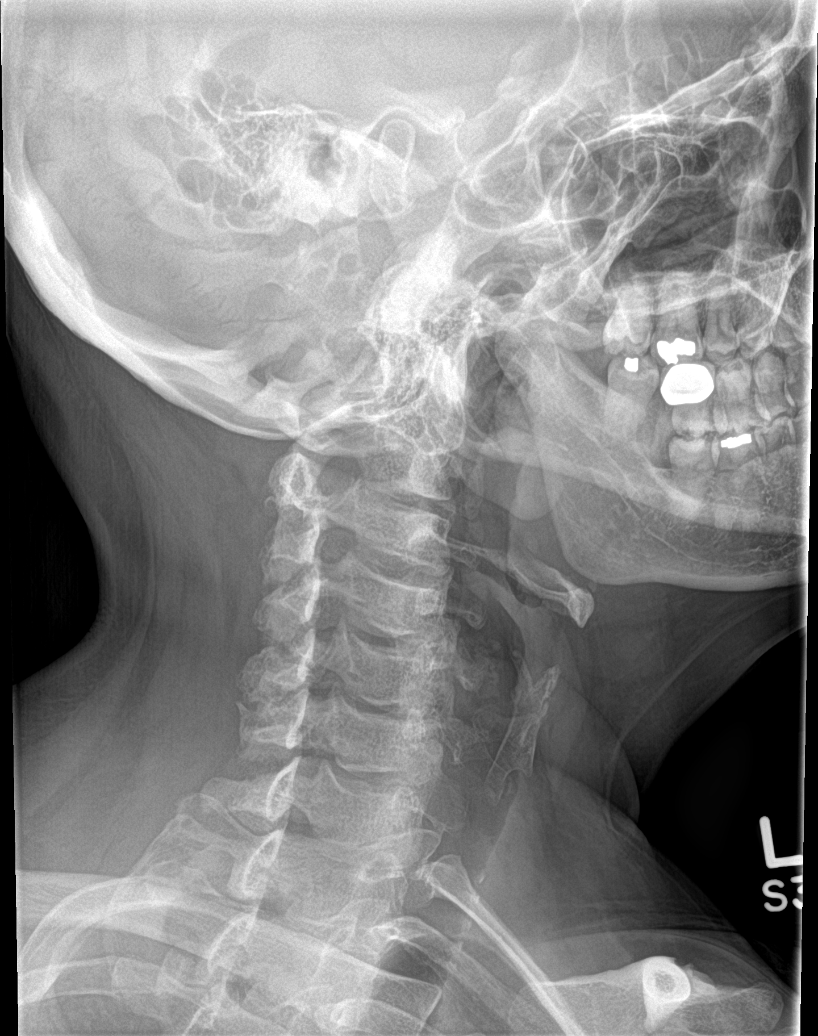

[c-spine obl (2 of 2)]
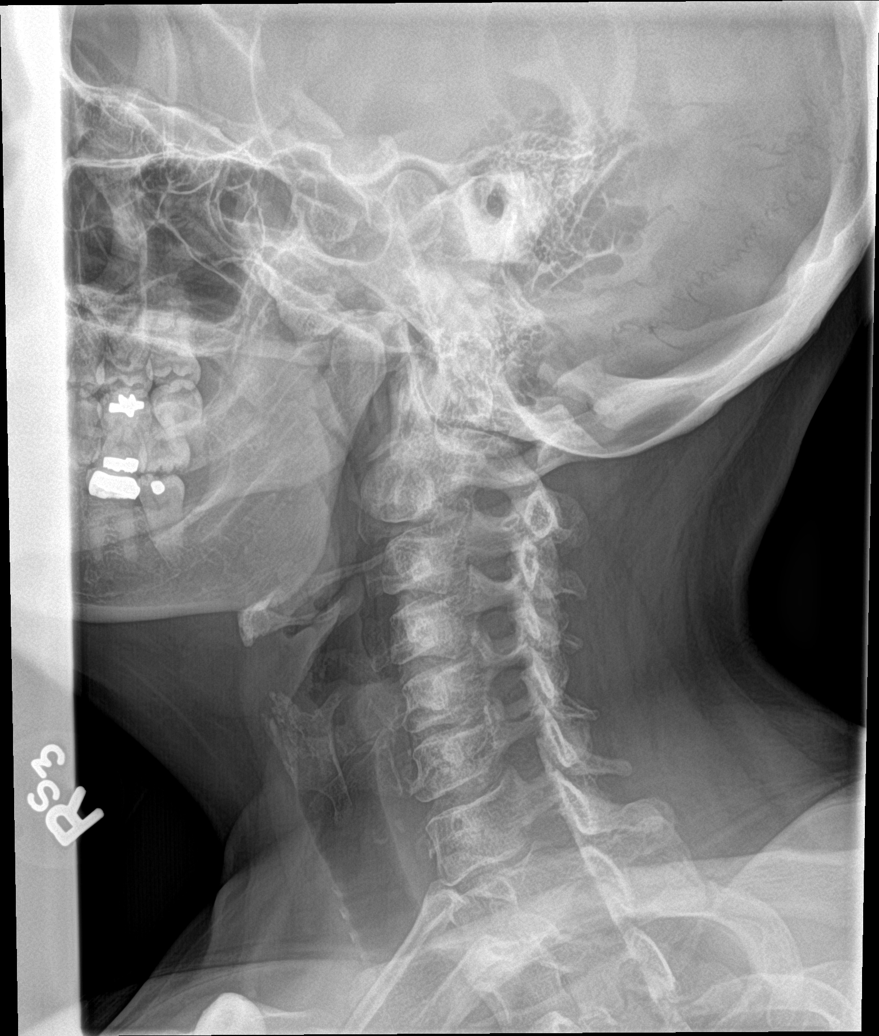

[c-spine ap]
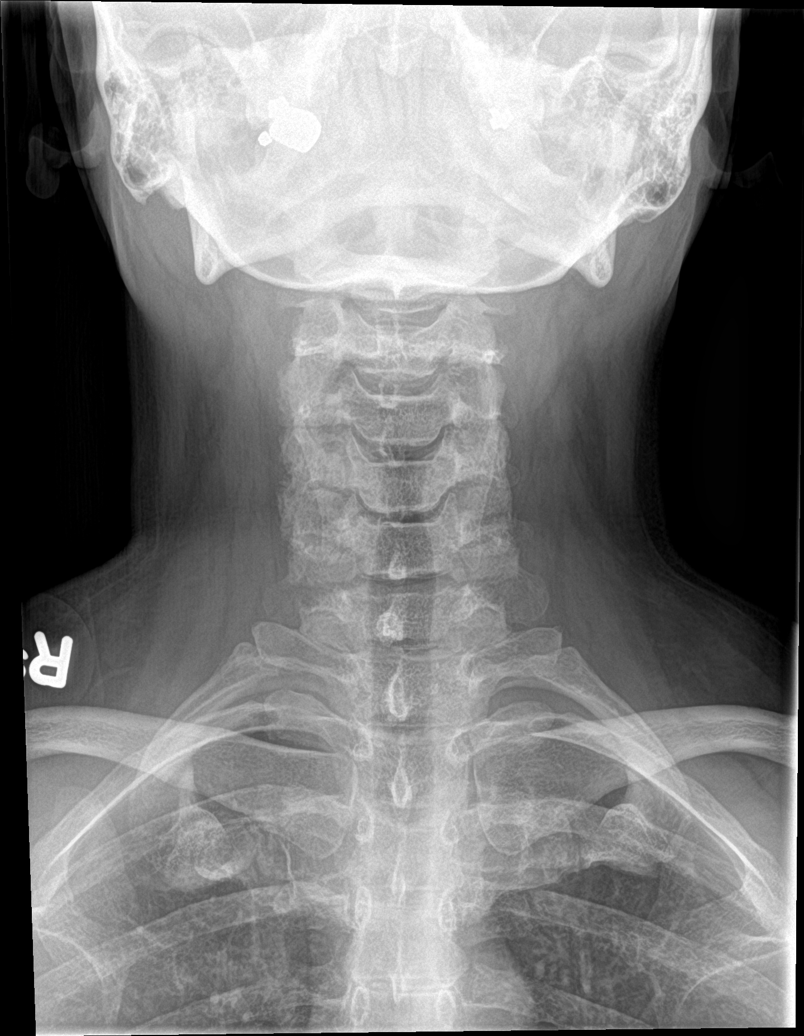

[c-spine open mouth]
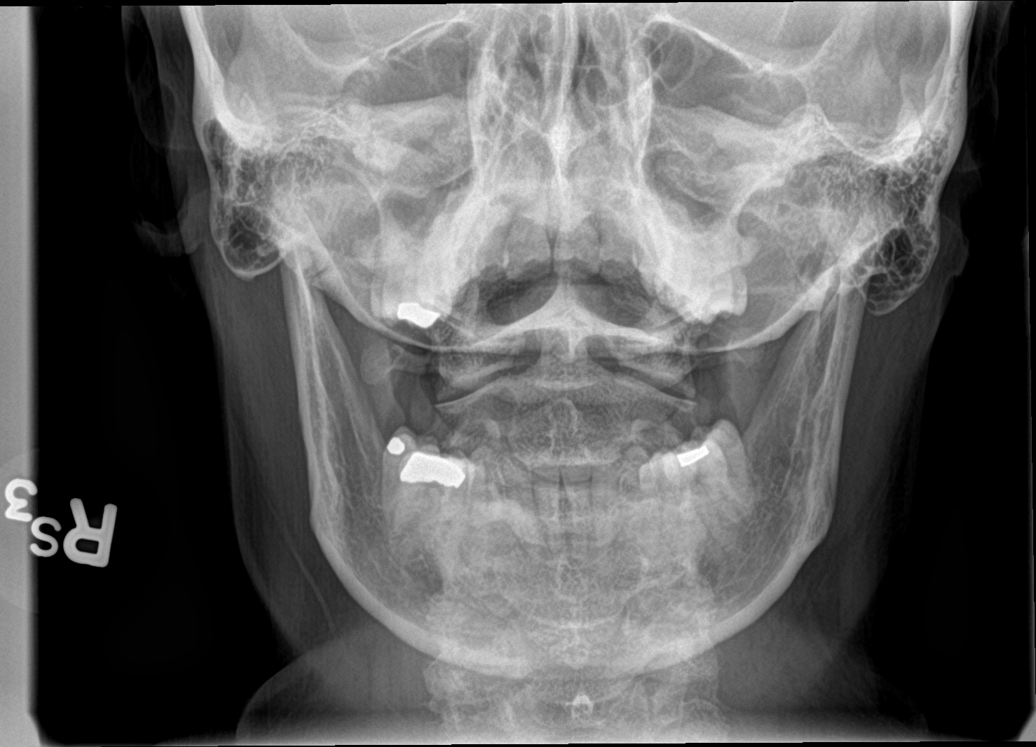

[5 of 5 positions shown; findings below may reference images not displayed]

FINDINGS: Relatively preserved cervical lordosis. Normal prevertebral soft
tissue contour. Cervicothoracic junction alignment is within normal
limits. Bilateral posterior element alignment is within normal
limits. Mild to moderate right side cervical facet hypertrophy most
notably at the C4, C5 and C6 levels. Mild disc space loss at C5-C6.
Normal AP alignment. Negative lung apices. Normal C1-C2 alignment.
IMPRESSION: 1.  No acute osseous abnormality in the cervical spine.
2. Multilevel right side cervical facet degeneration, especially C4
through C5. Evidence of disc degeneration at C5-C6.

## 2017-05-14 DIAGNOSIS — S134XXA Sprain of ligaments of cervical spine, initial encounter: Secondary | ICD-10-CM | POA: Diagnosis not present

## 2017-05-14 DIAGNOSIS — S336XXA Sprain of sacroiliac joint, initial encounter: Secondary | ICD-10-CM | POA: Diagnosis not present

## 2017-05-14 DIAGNOSIS — M546 Pain in thoracic spine: Secondary | ICD-10-CM | POA: Diagnosis not present

## 2017-05-25 ENCOUNTER — Ambulatory Visit: Payer: 59 | Admitting: Family Medicine

## 2017-05-25 ENCOUNTER — Encounter: Payer: Self-pay | Admitting: Family Medicine

## 2017-05-25 VITALS — BP 116/78 | Temp 98.3°F | Ht 59.0 in | Wt 157.0 lb

## 2017-05-25 DIAGNOSIS — M25551 Pain in right hip: Secondary | ICD-10-CM

## 2017-05-25 DIAGNOSIS — M25561 Pain in right knee: Secondary | ICD-10-CM

## 2017-05-25 DIAGNOSIS — G8929 Other chronic pain: Secondary | ICD-10-CM

## 2017-05-25 MED ORDER — DICLOFENAC SODIUM 75 MG PO TBEC: 75.0000 mg | DELAYED_RELEASE_TABLET | Freq: Two times a day (BID) | ORAL | 2 refills | Status: DC

## 2017-05-25 NOTE — Progress Notes (Signed)
Subjective:    Patient ID: Ana Gross, female    DOB: 1970/12/14, 46 y.o.   MRN: [id number] Patient has multiple regions of left leg pain Leg Pain   There was no injury mechanism. The pain is present in the right leg, right hip and right foot. She has tried ice, heat and NSAIDs for the symptoms.   Patient states no other concerns this visit.    First started in the hip  Two weeks ago or so, has been doing stretching of hip which has helped some  One day at work developed pain in the back of the knee, going onsdsmr furation  Pt statrs has bad veins  Ankle and foot stiff, worse  Burning and sting brusise on thefoot   exe4cise  Not so much these ays , working twelve hr shifts mot days, phys and mentally exhausted Works standing at [employer], works on hard surgaces  Takes ibu mot much, will take one or two may be once per day                                                                                                                                                                                                                                                                                                                                                                                                Review of Systems No headache, no major weight loss or weight gain, no chest pain no back pain abdominal pain no change in bowel habits complete ROS otherwise negative     Objective:   Physical Exam Alert vitals stable, NAD. Blood  pressure good on repeat. HEENT normal. Lungs clear. Heart regular rate  and rhythm.  Right hip.  Good range of motion.  Some lateral right hip tenderness.  To deep palpation.  Negative straight leg raise.  Her graph both knees crepitations right greater than left.  No obvious effusion no joint line injury.  Right lateral foot remnants of spider vein rupture and residual hematoma and tenderness      impression multiple regions of inflammation/exacerbated by 12-hour shifts with patient on her feet.  Trial of anti-inflammatory medication.  Local measures discussed.  Really will improve when she comes off these 12-hour shifts.  Probable element of knee osteoarthritis discussed       Assessment & Plan:

## 2017-07-02 ENCOUNTER — Telehealth: Payer: Self-pay | Admitting: Family Medicine

## 2017-07-02 DIAGNOSIS — M79673 Pain in unspecified foot: Secondary | ICD-10-CM

## 2017-07-02 DIAGNOSIS — M25561 Pain in right knee: Secondary | ICD-10-CM

## 2017-07-02 NOTE — Telephone Encounter (Signed)
Ana Gross, o v next wk foot unless pt wants [place] to send her to a pod, if she does, lets do that referral too

## 2017-07-02 NOTE — Telephone Encounter (Signed)
Left message to return call 

## 2017-07-02 NOTE — Telephone Encounter (Signed)
Recommend ortho referral

## 2017-07-02 NOTE — Telephone Encounter (Signed)
Patient seen Dr. Richardson Landry on 05/25/17 for right knee pain.  She said this is no better and wants to know what Dr. Richardson Landry recommends?  If she needs an appointment, she is requesting an appointment tomorrow after 4 pm.

## 2017-07-02 NOTE — Telephone Encounter (Signed)
Pt states her foot is not better either. Heel is sore and having tingling in right foot. Finished antiinflammatory and no better.

## 2017-07-02 NOTE — Telephone Encounter (Signed)
Discussed with pt. Pt wants to see [facility]. Referral put in

## 2017-07-03 NOTE — Addendum Note (Signed)
Addended by: Carmelina Noun on: 07/03/2017 08:50 AM   Modules accepted: Orders

## 2017-07-03 NOTE — Telephone Encounter (Signed)
Let's do 

## 2017-07-03 NOTE — Telephone Encounter (Signed)
done

## 2017-07-03 NOTE — Telephone Encounter (Signed)
Discussed with pt. Pt verbalized understanding. Wants referral to podiatry.

## 2017-07-06 ENCOUNTER — Encounter: Payer: Self-pay | Admitting: Family Medicine

## 2017-07-15 ENCOUNTER — Other Ambulatory Visit: Payer: Self-pay | Admitting: Nurse Practitioner

## 2017-07-20 DIAGNOSIS — M792 Neuralgia and neuritis, unspecified: Secondary | ICD-10-CM | POA: Diagnosis not present

## 2017-07-20 DIAGNOSIS — M25571 Pain in right ankle and joints of right foot: Secondary | ICD-10-CM | POA: Diagnosis not present

## 2017-07-20 DIAGNOSIS — M25561 Pain in right knee: Secondary | ICD-10-CM | POA: Diagnosis not present

## 2017-08-07 DIAGNOSIS — M25561 Pain in right knee: Secondary | ICD-10-CM | POA: Diagnosis not present

## 2017-08-28 DIAGNOSIS — S336XXA Sprain of sacroiliac joint, initial encounter: Secondary | ICD-10-CM | POA: Diagnosis not present

## 2017-08-28 DIAGNOSIS — M546 Pain in thoracic spine: Secondary | ICD-10-CM | POA: Diagnosis not present

## 2017-08-28 DIAGNOSIS — S134XXA Sprain of ligaments of cervical spine, initial encounter: Secondary | ICD-10-CM | POA: Diagnosis not present

## 2017-09-02 ENCOUNTER — Telehealth: Payer: Self-pay | Admitting: Family

## 2017-09-02 DIAGNOSIS — B9689 Other specified bacterial agents as the cause of diseases classified elsewhere: Secondary | ICD-10-CM

## 2017-09-02 DIAGNOSIS — J329 Chronic sinusitis, unspecified: Secondary | ICD-10-CM

## 2017-09-02 MED ORDER — DOXYCYCLINE HYCLATE 100 MG PO TABS: 100.0000 mg | ORAL_TABLET | Freq: Two times a day (BID) | ORAL | 0 refills | Status: DC

## 2017-09-02 NOTE — Progress Notes (Signed)
Thank you for the details you included in the comment boxes. Those details are very helpful in determining the best course of treatment for you and help us to provide the best care.  We are sorry that you are not feeling well.  Here is how we plan to help!  Based on what you have shared with me it looks like you have sinusitis.  Sinusitis is inflammation and infection in the sinus cavities of the head.  Based on your presentation I believe you most likely have Acute Bacterial Sinusitis.  This is an infection caused by bacteria and is treated with antibiotics. I have prescribed Doxycycline 100mg by mouth twice a day for 10 days. You may use an oral decongestant such as Mucinex D or if you have glaucoma or high blood pressure use plain Mucinex. Saline nasal spray help and can safely be used as often as needed for congestion.  If you develop worsening sinus pain, fever or notice severe headache and vision changes, or if symptoms are not better after completion of antibiotic, please schedule an appointment with a health care provider.    Sinus infections are not as easily transmitted as other respiratory infection, however we still recommend that you avoid close contact with loved ones, especially the very young and elderly.  Remember to wash your hands thoroughly throughout the day as this is the number one way to prevent the spread of infection!  Home Care:  Only take medications as instructed by your medical team.  Complete the entire course of an antibiotic.  Do not take these medications with alcohol.  A steam or ultrasonic humidifier can help congestion.  You can place a towel over your head and breathe in the steam from hot water coming from a faucet.  Avoid close contacts especially the very young and the elderly.  Cover your mouth when you cough or sneeze.  Always remember to wash your hands.  Get Help Right Away If:  You develop worsening fever or sinus pain.  You develop a severe  head ache or visual changes.  Your symptoms persist after you have completed your treatment plan.  Make sure you  Understand these instructions.  Will watch your condition.  Will get help right away if you are not doing well or get worse.  Your e-visit answers were reviewed by a board certified advanced clinical practitioner to complete your personal care plan.  Depending on the condition, your plan could have included both over the counter or prescription medications.  If there is a problem please reply  once you have received a response from your provider.  Your safety is important to us.  If you have drug allergies check your prescription carefully.    You can use MyChart to ask questions about today's visit, request a non-urgent call back, or ask for a work or school excuse for 24 hours related to this e-Visit. If it has been greater than 24 hours you will need to follow up with your provider, or enter a new e-Visit to address those concerns.  You will get an e-mail in the next two days asking about your experience.  I hope that your e-visit has been valuable and will speed your recovery. Thank you for using e-visits.     

## 2017-09-03 ENCOUNTER — Ambulatory Visit: Payer: 59 | Admitting: Family Medicine

## 2017-10-30 DIAGNOSIS — S336XXA Sprain of sacroiliac joint, initial encounter: Secondary | ICD-10-CM | POA: Diagnosis not present

## 2017-10-30 DIAGNOSIS — S134XXA Sprain of ligaments of cervical spine, initial encounter: Secondary | ICD-10-CM | POA: Diagnosis not present

## 2017-10-30 DIAGNOSIS — M546 Pain in thoracic spine: Secondary | ICD-10-CM | POA: Diagnosis not present

## 2017-12-11 DIAGNOSIS — S134XXA Sprain of ligaments of cervical spine, initial encounter: Secondary | ICD-10-CM | POA: Diagnosis not present

## 2017-12-11 DIAGNOSIS — M546 Pain in thoracic spine: Secondary | ICD-10-CM | POA: Diagnosis not present

## 2017-12-11 DIAGNOSIS — S336XXA Sprain of sacroiliac joint, initial encounter: Secondary | ICD-10-CM | POA: Diagnosis not present

## 2017-12-16 DIAGNOSIS — S134XXA Sprain of ligaments of cervical spine, initial encounter: Secondary | ICD-10-CM | POA: Diagnosis not present

## 2017-12-16 DIAGNOSIS — S336XXA Sprain of sacroiliac joint, initial encounter: Secondary | ICD-10-CM | POA: Diagnosis not present

## 2017-12-16 DIAGNOSIS — M546 Pain in thoracic spine: Secondary | ICD-10-CM | POA: Diagnosis not present

## 2017-12-21 DIAGNOSIS — S336XXA Sprain of sacroiliac joint, initial encounter: Secondary | ICD-10-CM | POA: Diagnosis not present

## 2017-12-21 DIAGNOSIS — S134XXA Sprain of ligaments of cervical spine, initial encounter: Secondary | ICD-10-CM | POA: Diagnosis not present

## 2017-12-21 DIAGNOSIS — M546 Pain in thoracic spine: Secondary | ICD-10-CM | POA: Diagnosis not present

## 2017-12-25 DIAGNOSIS — S134XXA Sprain of ligaments of cervical spine, initial encounter: Secondary | ICD-10-CM | POA: Diagnosis not present

## 2017-12-25 DIAGNOSIS — S336XXA Sprain of sacroiliac joint, initial encounter: Secondary | ICD-10-CM | POA: Diagnosis not present

## 2017-12-25 DIAGNOSIS — M546 Pain in thoracic spine: Secondary | ICD-10-CM | POA: Diagnosis not present

## 2017-12-30 DIAGNOSIS — M546 Pain in thoracic spine: Secondary | ICD-10-CM | POA: Diagnosis not present

## 2017-12-30 DIAGNOSIS — S134XXA Sprain of ligaments of cervical spine, initial encounter: Secondary | ICD-10-CM | POA: Diagnosis not present

## 2017-12-30 DIAGNOSIS — S336XXA Sprain of sacroiliac joint, initial encounter: Secondary | ICD-10-CM | POA: Diagnosis not present

## 2018-01-07 ENCOUNTER — Ambulatory Visit (HOSPITAL_COMMUNITY)
Admission: RE | Admit: 2018-01-07 | Discharge: 2018-01-07 | Disposition: A | Discharge status: Home / Self Care | Payer: 59 | Source: Ambulatory Visit | Attending: Cardiology | Admitting: Cardiology

## 2018-01-07 ENCOUNTER — Other Ambulatory Visit (HOSPITAL_COMMUNITY): Payer: Self-pay | Admitting: Sports Medicine

## 2018-01-07 DIAGNOSIS — M7989 Other specified soft tissue disorders: Secondary | ICD-10-CM | POA: Insufficient documentation

## 2018-01-07 DIAGNOSIS — R2241 Localized swelling, mass and lump, right lower limb: Secondary | ICD-10-CM | POA: Diagnosis not present

## 2018-01-07 DIAGNOSIS — M25561 Pain in right knee: Secondary | ICD-10-CM | POA: Diagnosis not present

## 2018-01-25 DIAGNOSIS — R2241 Localized swelling, mass and lump, right lower limb: Secondary | ICD-10-CM | POA: Diagnosis not present

## 2018-01-25 DIAGNOSIS — M25561 Pain in right knee: Secondary | ICD-10-CM | POA: Diagnosis not present

## 2018-01-30 DIAGNOSIS — M25561 Pain in right knee: Secondary | ICD-10-CM | POA: Diagnosis not present

## 2018-02-04 DIAGNOSIS — M25561 Pain in right knee: Secondary | ICD-10-CM | POA: Diagnosis not present

## 2018-02-26 DIAGNOSIS — M25561 Pain in right knee: Secondary | ICD-10-CM | POA: Diagnosis not present

## 2018-08-09 DIAGNOSIS — Z01419 Encounter for gynecological examination (general) (routine) without abnormal findings: Secondary | ICD-10-CM | POA: Diagnosis not present

## 2018-08-18 DIAGNOSIS — N6321 Unspecified lump in the left breast, upper outer quadrant: Secondary | ICD-10-CM | POA: Diagnosis not present

## 2018-08-18 DIAGNOSIS — R922 Inconclusive mammogram: Secondary | ICD-10-CM | POA: Diagnosis not present

## 2018-08-18 DIAGNOSIS — N6002 Solitary cyst of left breast: Secondary | ICD-10-CM | POA: Diagnosis not present

## 2018-08-18 DIAGNOSIS — N6325 Unspecified lump in the left breast, overlapping quadrants: Secondary | ICD-10-CM | POA: Diagnosis not present

## 2018-08-31 DIAGNOSIS — D229 Melanocytic nevi, unspecified: Secondary | ICD-10-CM | POA: Diagnosis not present

## 2018-08-31 DIAGNOSIS — L821 Other seborrheic keratosis: Secondary | ICD-10-CM | POA: Diagnosis not present

## 2018-09-01 DIAGNOSIS — D242 Benign neoplasm of left breast: Secondary | ICD-10-CM | POA: Diagnosis not present

## 2018-09-01 DIAGNOSIS — N6321 Unspecified lump in the left breast, upper outer quadrant: Secondary | ICD-10-CM | POA: Diagnosis not present

## 2019-04-12 ENCOUNTER — Other Ambulatory Visit: Payer: Self-pay

## 2019-04-12 ENCOUNTER — Encounter: Payer: Self-pay | Admitting: Family Medicine

## 2019-04-12 ENCOUNTER — Ambulatory Visit (INDEPENDENT_AMBULATORY_CARE_PROVIDER_SITE_OTHER): Payer: 59 | Admitting: Family Medicine

## 2019-04-12 DIAGNOSIS — R21 Rash and other nonspecific skin eruption: Secondary | ICD-10-CM | POA: Diagnosis not present

## 2019-04-12 NOTE — Progress Notes (Signed)
   Subjective:  Audio plus video  Patient ID: Ana Gross, female    DOB: [birth year], 48 y.o.   MRN: [id number]  HPI bump on left wrist. Yesterday the size of a quarter. Today less than the size of a dime. Has a hole in the center and has redness around it. No fever. Nurse at work last night put on neosporin and has tried ice. Nurse told her it looked like a spider bite. Yesterday had some clear drainage come out.  Virtual Visit via Video Note  I connected with Corinna Capra on 04/12/19 at  8:30 AM EDT by a video enabled telemedicine application and verified that I am speaking with the correct person using two identifiers.  Location: Patient: home Provider: office   I discussed the limitations of evaluation and management by telemedicine and the availability of in person appointments. The patient expressed understanding and agreed to proceed.  History of Present Illness:    Observations/Objective:   Assessment and Plan:   Follow Up Instructions:    I discussed the assessment and treatment plan with the patient. The patient was provided an opportunity to ask questions and all were answered. The patient agreed with the plan and demonstrated an understanding of the instructions.   The patient was advised to call back or seek an in-person evaluation if the symptoms worsen or if the condition fails to improve as anticipated.  I provided 18 minutes of non-face-to-face time during this encounter.   Patient did get outdoors quite a bit.  Utilize quads.  Worked in brush.  May well have gotten a bite.     Review of Systems No fever no chills no achiness no systemic symptoms    Objective:   Physical Exam  Wrist reveals an erythematous and large patchy red region with a central area of skin breakdown      Assessment & Plan:  Impression probable bite nonvenomous, gout substantial infection.  Rationale discussed.  Mostly itchy and not very sensitive to touch.  Symptom care  only warning signs discussed

## 2019-05-05 DIAGNOSIS — M79671 Pain in right foot: Secondary | ICD-10-CM | POA: Insufficient documentation

## 2019-05-05 DIAGNOSIS — M7731 Calcaneal spur, right foot: Secondary | ICD-10-CM | POA: Insufficient documentation

## 2019-06-22 DIAGNOSIS — M7661 Achilles tendinitis, right leg: Secondary | ICD-10-CM | POA: Insufficient documentation

## 2019-06-22 DIAGNOSIS — M6701 Short Achilles tendon (acquired), right ankle: Secondary | ICD-10-CM | POA: Insufficient documentation

## 2019-08-24 ENCOUNTER — Encounter: Payer: Self-pay | Admitting: Family Medicine

## 2019-09-21 ENCOUNTER — Other Ambulatory Visit: Payer: Self-pay

## 2019-09-21 ENCOUNTER — Ambulatory Visit (INDEPENDENT_AMBULATORY_CARE_PROVIDER_SITE_OTHER): Payer: 59

## 2019-09-21 ENCOUNTER — Ambulatory Visit: Payer: 59 | Admitting: Podiatry

## 2019-09-21 ENCOUNTER — Other Ambulatory Visit: Payer: Self-pay | Admitting: Podiatry

## 2019-09-21 ENCOUNTER — Encounter: Payer: Self-pay | Admitting: Podiatry

## 2019-09-21 VITALS — BP 139/77 | HR 69 | Temp 97.3°F | Resp 16

## 2019-09-21 DIAGNOSIS — M722 Plantar fascial fibromatosis: Secondary | ICD-10-CM | POA: Diagnosis not present

## 2019-09-21 DIAGNOSIS — M7661 Achilles tendinitis, right leg: Secondary | ICD-10-CM

## 2019-09-21 DIAGNOSIS — M79671 Pain in right foot: Secondary | ICD-10-CM

## 2019-09-21 NOTE — Patient Instructions (Signed)

## 2019-09-21 NOTE — Progress Notes (Signed)
Subjective:   Patient ID: Ana Gross, female   DOB: 49 y.o.   MRN: [id number]   HPI Patient presents stating that she has had a lot of pain on the bottom and back of her right heel and that she is here for second opinion.  Has been recommended to have surgery has been in a boot has had physical therapy dry needling and likes to walk 6 to 8 miles per day.  Patient states that also has an MRI and is been diagnosed with Achilles tendinitis and patient does not smoke likes to be active   Review of Systems  All other systems reviewed and are negative.       Objective:  Physical Exam Vitals and nursing note reviewed.  Constitutional:      Appearance: She is well-developed.  Pulmonary:     Effort: Pulmonary effort is normal.  Musculoskeletal:        General: Normal range of motion.  Skin:    General: Skin is warm.  Neurological:     Mental Status: She is alert.     Neurovascular status intact muscle strength was found to be adequate range of motion within normal limits.  Patient is noted to have most of her discomfort in the plantar heel right at the insertion tendon calcaneus with mild to moderate discomfort in the posterior aspect of the heel at the insertion of the Achilles into the posterior heel.  Patient is noted to have good digital perfusion is well oriented x3 and has  equinus condition     Assessment:  What appears to be plantar fasciitis and Achilles tendinitis right with difficulty seeing which is most intense but at this time it appears to be more plantar patient is concerned about work she is already missed and the fact she has been in a walking boot     Plan:  H&P all conditions reviewed discussed and x-rays reviewed.  Today Ana Gross to focus on the plantar and I did sterile prep and injected the plantar fascia 3 mg Kenalog 5 mg Xylocaine and applied fascial brace to lift up the arch.  Patient will be seen back in the next 3 weeks we may consider orthotics or other  treatments depending on response conservatively  X-rays indicate small posterior spur no indications of plantar spur formation currently with no indication stress fracture arthritis

## 2019-09-21 NOTE — Progress Notes (Signed)
   Subjective:    Patient ID: Ana Gross, female    DOB: Oct 24, 1970, 49 y.o.   MRN: [id number]  HPI    Review of Systems  All other systems reviewed and are negative.      Objective:   Physical Exam        Assessment & Plan:

## 2019-09-30 ENCOUNTER — Encounter: Payer: Self-pay | Admitting: Podiatry

## 2019-09-30 ENCOUNTER — Other Ambulatory Visit: Payer: Self-pay

## 2019-09-30 ENCOUNTER — Ambulatory Visit: Payer: 59 | Admitting: Podiatry

## 2019-09-30 VITALS — Temp 97.1°F

## 2019-09-30 DIAGNOSIS — M722 Plantar fascial fibromatosis: Secondary | ICD-10-CM

## 2019-09-30 DIAGNOSIS — M7661 Achilles tendinitis, right leg: Secondary | ICD-10-CM

## 2019-09-30 NOTE — Progress Notes (Signed)
Subjective:   Patient ID: Ana Gross, female   DOB: 49 y.o.   MRN: [id number]   HPI Patient presents stating that the bottom of her heel seems better but the back of her heel still really bothering her and giving her pain.  So far she seems to have some improvement based on her first treatment   ROS      Objective:  Physical Exam  Neurovascular status intact with patient's plantar heel quite a bit improved with diminished discomfort noted and pain and I noted on the lateral side of the posterior Achilles there is discomfort and pain with center, medial in good shape currently     Assessment:  Improvement with plantar fasciitis right with Achilles tendinitis lateral right     Plan:  H&P reviewed condition.  At this time we will get a go ahead and do a careful injection of the Achilles lateral side explaining risk versus the patient and she is willing to accept this risk and understands she needs to take it easy on this for several days and to be careful with that and that rupture can occur.  I did sterile prep and injected the lateral staying away from the center medial with 3 mg Dexasone 5 mg Xylocaine.  I then went ahead and casted for functional orthotics to try to provide for better foot support and encouraged her to wear open back shoes until we see her back in 3 weeks

## 2019-10-19 ENCOUNTER — Ambulatory Visit: Payer: 59 | Admitting: Podiatry

## 2019-10-19 ENCOUNTER — Other Ambulatory Visit: Payer: Self-pay

## 2019-10-19 ENCOUNTER — Encounter: Payer: Self-pay | Admitting: Podiatry

## 2019-10-19 VITALS — Temp 98.0°F

## 2019-10-19 DIAGNOSIS — M7661 Achilles tendinitis, right leg: Secondary | ICD-10-CM | POA: Diagnosis not present

## 2019-10-19 DIAGNOSIS — M722 Plantar fascial fibromatosis: Secondary | ICD-10-CM

## 2019-10-19 NOTE — Progress Notes (Signed)
Subjective:   Patient ID: Ana Gross, female   DOB: 49 y.o.   MRN: [id number]   HPI Patient states overall doing better and states that she is on her foot too much it still can bother her some in the bottom and the back but overall improved from where we were previously   ROS      Objective:  Physical Exam  Neurovascular status intact with diminished discomfort posterior and plantar heel right with good range of motion no indications of restriction     Assessment:  Doing well with separate conditions of Achilles tendinitis right plantar fasciitis right     Plan:  H&P reviewed condition recommended physical therapy anti-inflammatories and reviewed oral medications and topicals she can use.  Orthotics dispensed with all instructions I want to see her back 6 weeks or earlier if needed

## 2019-11-03 ENCOUNTER — Encounter: Payer: Self-pay | Admitting: Family Medicine

## 2019-11-30 ENCOUNTER — Ambulatory Visit: Payer: 59 | Admitting: Podiatry

## 2019-12-07 ENCOUNTER — Encounter: Payer: Self-pay | Admitting: Podiatry

## 2019-12-07 ENCOUNTER — Ambulatory Visit: Payer: 59 | Admitting: Podiatry

## 2019-12-07 ENCOUNTER — Other Ambulatory Visit: Payer: Self-pay

## 2019-12-07 VITALS — Temp 97.9°F

## 2019-12-07 DIAGNOSIS — M7661 Achilles tendinitis, right leg: Secondary | ICD-10-CM | POA: Diagnosis not present

## 2019-12-07 DIAGNOSIS — M779 Enthesopathy, unspecified: Secondary | ICD-10-CM

## 2019-12-07 MED ORDER — DICLOFENAC SODIUM 75 MG PO TBEC: 75.0000 mg | DELAYED_RELEASE_TABLET | Freq: Two times a day (BID) | ORAL | 2 refills | Status: DC

## 2019-12-07 NOTE — Progress Notes (Signed)
Subjective:   Patient ID: Ana Gross, female   DOB: 49 y.o.   MRN: [id number]   HPI Patient states she still is getting some pain in the back of her Achilles tendon but she is having more problems in her ankle joint and that seems to be the primary issue that she is dealing with gout.   ROS      Objective:  Physical Exam  Neurovascular status intact with Achilles tendinitis plantar fasciitis right that still present but with moderate improvement with inflammation pain of the sinus tarsi right with fluid buildup within that joint surface itself.  Patient is found to have good digital perfusion well oriented x3     Assessment:  Inflammatory capsulitis of the sinus tarsi right with Achilles tendinitis still present but improved     Plan:  H&P conditions reviewed sterile prep done and injected the sinus tarsi right 3 mg Kenalog 5 mg Xylocaine  Discussed the Achilles tendon continue orthotics and gradually increase usage over the next few months and will be seen back if symptoms persist or get worse

## 2020-03-09 ENCOUNTER — Encounter: Payer: Self-pay | Admitting: Podiatry

## 2020-03-09 ENCOUNTER — Ambulatory Visit (INDEPENDENT_AMBULATORY_CARE_PROVIDER_SITE_OTHER): Payer: 59

## 2020-03-09 ENCOUNTER — Other Ambulatory Visit: Payer: Self-pay

## 2020-03-09 ENCOUNTER — Ambulatory Visit (INDEPENDENT_AMBULATORY_CARE_PROVIDER_SITE_OTHER): Payer: 59 | Admitting: Podiatry

## 2020-03-09 DIAGNOSIS — M7661 Achilles tendinitis, right leg: Secondary | ICD-10-CM

## 2020-03-09 DIAGNOSIS — M7751 Other enthesopathy of right foot: Secondary | ICD-10-CM | POA: Diagnosis not present

## 2020-03-09 DIAGNOSIS — M76821 Posterior tibial tendinitis, right leg: Secondary | ICD-10-CM

## 2020-03-09 DIAGNOSIS — M779 Enthesopathy, unspecified: Secondary | ICD-10-CM | POA: Diagnosis not present

## 2020-03-09 MED ORDER — PREDNISONE 10 MG PO TABS: ORAL_TABLET | ORAL | 0 refills | Status: DC

## 2020-03-14 NOTE — Progress Notes (Signed)
Subjective:   Patient ID: Ana Gross, female   DOB: 49 y.o.   MRN: [id number]   HPI Patient presents stating the right heel has been hurting again and I am having trouble with the back of my tendon also.  Patient states that it is been ongoing and hard to walk with comfortably and simply does not seem to be improving   ROS      Objective:  Physical Exam  Neurovascular status found to be intact muscle strength was found to be adequate with patient noted to have exquisite discomfort both plantar aspect right heel and also discomfort in the posterior aspect of the right heel with inflammation fluid buildup.  States that she has trouble ambulating     Assessment:  Acute plantar fasciitis with tendinitis symptoms and also Achilles tendinitis eight     Plan:  H&P reviewed both conditions.  At this point I did do sterile prep and injected the fascia 3 mg Kenalog 5 mg Xylocaine advised on topical medicines oral medications and also applied air fracture walker to completely immobilize and allow this foot to rest.  Patient will get 2 weeks off work currently due to the intensity of discomfort and will be seen back and hopefully will get the symptoms under control and avoid surgery  Precautionary x-rays of the left were negative for signs of fracture or arthritic condition and appears to be more in compensation for the right

## 2020-03-17 DIAGNOSIS — M79676 Pain in unspecified toe(s): Secondary | ICD-10-CM

## 2020-04-05 ENCOUNTER — Ambulatory Visit: Payer: 59 | Admitting: Podiatry

## 2020-04-05 ENCOUNTER — Encounter: Payer: Self-pay | Admitting: Podiatry

## 2020-04-05 ENCOUNTER — Other Ambulatory Visit: Payer: Self-pay

## 2020-04-05 DIAGNOSIS — M722 Plantar fascial fibromatosis: Secondary | ICD-10-CM | POA: Diagnosis not present

## 2020-04-06 ENCOUNTER — Ambulatory Visit: Payer: 59 | Admitting: Podiatry

## 2020-04-10 NOTE — Progress Notes (Signed)
Subjective:   Patient ID: Ana Gross, female   DOB: 49 y.o.   MRN: [id number]   HPI Patient states she is having a lot of pain in her right heel and states it seems to be worse when she gets up in the morning and after periods of sitting. States she is doing some better when she is on her foot during the day   ROS      Objective:  Physical Exam  Neurovascular status is found to be intact with continued discomfort of the plantar fascia at the insertion of the tendon into the calcaneus     Assessment:  Continued acute plantar fasciitis that is worse after periods of sitting and after waking up in the morning     Plan:  Reviewed condition and continued physical therapy shoe gear modifications. I did do sterile prep and reinjected the fascia 3 mg Kenalog 5 mg Xylocaine and then applied night splint with all instructions on usage along with aggressive ice therapy. Patient will be checked back within the next 4 weeks or earlier if needed

## 2020-04-24 ENCOUNTER — Ambulatory Visit (INDEPENDENT_AMBULATORY_CARE_PROVIDER_SITE_OTHER): Payer: 59 | Admitting: Dermatology

## 2020-04-24 ENCOUNTER — Encounter: Payer: Self-pay | Admitting: Dermatology

## 2020-04-24 ENCOUNTER — Other Ambulatory Visit: Payer: Self-pay

## 2020-04-24 DIAGNOSIS — Z1283 Encounter for screening for malignant neoplasm of skin: Secondary | ICD-10-CM

## 2020-04-24 DIAGNOSIS — L821 Other seborrheic keratosis: Secondary | ICD-10-CM | POA: Diagnosis not present

## 2020-04-24 DIAGNOSIS — L570 Actinic keratosis: Secondary | ICD-10-CM

## 2020-04-24 NOTE — Progress Notes (Signed)
LN2   x1 on the left forearm x1 on the right rim of ear x1 on the mid of the sternum

## 2020-04-26 ENCOUNTER — Other Ambulatory Visit: Payer: Self-pay

## 2020-04-26 ENCOUNTER — Ambulatory Visit (INDEPENDENT_AMBULATORY_CARE_PROVIDER_SITE_OTHER): Payer: 59 | Admitting: Dermatology

## 2020-04-26 ENCOUNTER — Encounter: Payer: Self-pay | Admitting: Dermatology

## 2020-04-26 DIAGNOSIS — L720 Epidermal cyst: Secondary | ICD-10-CM

## 2020-04-26 NOTE — Patient Instructions (Signed)

## 2020-05-01 ENCOUNTER — Encounter: Payer: Self-pay | Admitting: Dermatology

## 2020-05-01 ENCOUNTER — Ambulatory Visit (INDEPENDENT_AMBULATORY_CARE_PROVIDER_SITE_OTHER): Payer: 59 | Admitting: *Deleted

## 2020-05-01 ENCOUNTER — Other Ambulatory Visit: Payer: Self-pay

## 2020-05-01 DIAGNOSIS — L729 Follicular cyst of the skin and subcutaneous tissue, unspecified: Secondary | ICD-10-CM

## 2020-05-01 NOTE — Progress Notes (Signed)
Patient here for suture removal x 2; no sign or symptom of infection. Pathology to patient

## 2020-05-10 ENCOUNTER — Ambulatory Visit: Payer: 59 | Admitting: Podiatry

## 2020-05-25 ENCOUNTER — Encounter: Payer: Self-pay | Admitting: Podiatry

## 2020-05-25 ENCOUNTER — Ambulatory Visit (INDEPENDENT_AMBULATORY_CARE_PROVIDER_SITE_OTHER): Payer: 59 | Admitting: Podiatry

## 2020-05-25 DIAGNOSIS — M7661 Achilles tendinitis, right leg: Secondary | ICD-10-CM | POA: Diagnosis not present

## 2020-05-25 DIAGNOSIS — M722 Plantar fascial fibromatosis: Secondary | ICD-10-CM

## 2020-05-26 NOTE — Progress Notes (Signed)
Subjective:   Patient ID: Ana Gross, female   DOB: 49 y.o.   MRN: [id number]   HPI Patient states she is showing improvement but still has a pain and states at times also the back of the heel is still bothering her and states she is able to wear the boot as much as possible but still cannot wear at work   ROS      Objective:  Physical Exam  Neurovascular status intact with discomfort plantar aspect right heel that improved but still present with posterior pain that continues to be mildly tender but improved     Assessment:  Gradual increase in improvement in 2 separate conditions post plantar and posterior heel right     Plan:  H&P reviewed condition and recommended the continued usage of boot and slow reduction of it over the next [redacted] weeks along with increased physical therapy night splint usage ice therapy and shoe gear modifications.  Reviewed all possibilities long-term including surgery and explained what would be required if it comes to the

## 2020-05-27 ENCOUNTER — Encounter: Payer: Self-pay | Admitting: Dermatology

## 2020-05-27 NOTE — Progress Notes (Signed)
   Follow-Up Visit   Subjective  Ana Gross is a 49 y.o. female who presents for the following: Skin Problem (check spots on left arm (in the last few months), mid upper chest ( in the last few months), rim of right ear, and mid back).  Crusts Location: Arm, chest, ear Duration:  Quality:  Associated Signs/Symptoms: Modifying Factors:  Severity:  Timing: Context:   Objective  Well appearing patient in no apparent distress; mood and affect are within normal limits.  All sun exposed areas plus back examined.   Assessment & Plan    AK (actinic keratosis) (3) Left Forearm - Posterior; Chest - Medial [facility]); Right Postauricular Sulcus  Destruction of lesion - Chest - Medial (Center), Left Forearm - Posterior Complexity: simple   Destruction method: cryotherapy   Informed consent: discussed and consent obtained   Lesion destroyed using liquid nitrogen: Yes   Cryotherapy cycles:  5 Outcome: patient tolerated procedure well with no complications    Seborrheic keratosis Right Upper Back  Leave if stable  Encounter for screening for malignant neoplasm of skin Mid Back  Annual skin examination      I, Lavonna Monarch, MD, have reviewed all documentation for this visit.  The documentation on 05/27/20 for the exam, diagnosis, procedures, and orders are all accurate and complete.

## 2020-06-02 ENCOUNTER — Telehealth: Payer: 59 | Admitting: Emergency Medicine

## 2020-06-02 DIAGNOSIS — J329 Chronic sinusitis, unspecified: Secondary | ICD-10-CM

## 2020-06-02 MED ORDER — AMOXICILLIN-POT CLAVULANATE 875-125 MG PO TABS: 1.0000 | ORAL_TABLET | Freq: Two times a day (BID) | ORAL | 0 refills | Status: DC

## 2020-06-02 NOTE — Progress Notes (Signed)
Time spent: 10 min  We are sorry that you are not feeling well.  Here is how we plan to help!  Based on what you have shared with me it looks like you have sinusitis.  Sinusitis is inflammation and infection in the sinus cavities of the head.  Based on your presentation I believe you most likely have Acute Bacterial Sinusitis.  This is an infection caused by bacteria and is treated with antibiotics. I have prescribed Augmentin 875mg /125mg  one tablet twice daily with food, for 7 days. You may use an oral decongestant such as Mucinex D or if you have glaucoma or high blood pressure use plain Mucinex. Saline nasal spray help and can safely be used as often as needed for congestion.  If you develop worsening sinus pain, fever or notice severe headache and vision changes, or if symptoms are not better after completion of antibiotic, please schedule an appointment with a health care provider.    Sinus infections are not as easily transmitted as other respiratory infection, however we still recommend that you avoid close contact with loved ones, especially the very young and elderly.  Remember to wash your hands thoroughly throughout the day as this is the number one way to prevent the spread of infection!  Home Care:  Only take medications as instructed by your medical team.  Complete the entire course of an antibiotic.  Do not take these medications with alcohol.  A steam or ultrasonic humidifier can help congestion.  You can place a towel over your head and breathe in the steam from hot water coming from a faucet.  Avoid close contacts especially the very young and the elderly.  Cover your mouth when you cough or sneeze.  Always remember to wash your hands.  Get Help Right Away If:  You develop worsening fever or sinus pain.  You develop a severe head ache or visual changes.  Your symptoms persist after you have completed your treatment plan.  Make sure you  Understand these  instructions.  Will watch your condition.  Will get help right away if you are not doing well or get worse.  Your e-visit answers were reviewed by a board certified advanced clinical practitioner to complete your personal care plan.  Depending on the condition, your plan could have included both over the counter or prescription medications.  If there is a problem please reply  once you have received a response from your provider.  Your safety is important to [place].  If you have drug allergies check your prescription carefully.    You can use MyChart to ask questions about today's visit, request a non-urgent call back, or ask for a work or school excuse for 24 hours related to this e-Visit. If it has been greater than 24 hours you will need to follow up with your provider, or enter a new e-Visit to address those concerns.  You will get an e-mail in the next two days asking about your experience.  I hope that your e-visit has been valuable and will speed your recovery. Thank you for using e-visits.

## 2020-06-03 ENCOUNTER — Encounter: Payer: Self-pay | Admitting: Dermatology

## 2020-06-03 NOTE — Progress Notes (Signed)
   Follow-Up Visit   Subjective  Artemisa Sladek is a 49 y.o. female who presents for the following: Cyst (neck). Cyst Location: Front of neck Duration:  Quality:  Associated Signs/Symptoms: Modifying Factors:  Severity:  Timing: Context: Patient requests removal  Objective  Well appearing patient in no apparent distress; mood and affect are within normal limits.  A focused examination was performed including Head and neck.. Relevant physical exam findings are noted in the Assessment and Plan.   Assessment & Plan    Epidermal cyst Neck - Anterior  Skin excision - Neck - Anterior  Lesion length (cm):  1 Lesion width (cm):  1 Margin per side (cm):  0 Total excision diameter (cm):  1 Informed consent: discussed and consent obtained   Timeout: patient name, date of birth, surgical site, and procedure verified   Anesthesia: the lesion was anesthetized in a standard fashion   Anesthetic:  1% lidocaine w/ epinephrine 1-100,000 local infiltration Instrument used: #15 blade   Hemostasis achieved with: pressure and electrodesiccation   Outcome: patient tolerated procedure well with no complications   Post-procedure details: sterile dressing applied and wound care instructions given   Dressing type: bandage, petrolatum and pressure dressing   Additional details:  Nylon Look x 2 sutures   Specimen 1 - Surgical pathology Differential Diagnosis: cyst Check Margins: No      I, Lavonna Monarch, MD, have reviewed all documentation for this visit.  The documentation on 06/03/20 for the exam, diagnosis, procedures, and orders are all accurate and complete.

## 2020-08-29 ENCOUNTER — Other Ambulatory Visit: Payer: Self-pay

## 2020-08-29 ENCOUNTER — Encounter: Payer: Self-pay | Admitting: Family Medicine

## 2020-08-29 ENCOUNTER — Ambulatory Visit: Payer: 59 | Admitting: Family Medicine

## 2020-08-29 VITALS — BP 122/82 | HR 87 | Temp 97.0°F | Ht 59.0 in | Wt 155.4 lb

## 2020-08-29 DIAGNOSIS — M7989 Other specified soft tissue disorders: Secondary | ICD-10-CM | POA: Diagnosis not present

## 2020-08-29 NOTE — Patient Instructions (Addendum)
Get labs. Follow-up in 2 weeks. Pay attention to sodium in diet.      Arthritis Arthritis means joint pain. It can also mean joint disease. A joint is a place where bones come together. There are more than 100 types of arthritis. What are the causes? This condition may be caused by:  Wear and tear of a joint. This is the most common cause.  A lot of acid in the blood, which leads to pain in the joint (gout).  Pain and swelling (inflammation) in a joint.  Infection of a joint.  Injuries in the joint.  A reaction to medicines (allergy). In some cases, the cause may not be known. What are the signs or symptoms? Symptoms of this condition include:  Redness at a joint.  Swelling at a joint.  Stiffness at a joint.  Warmth coming from the joint.  A fever.  A feeling of being sick. How is this treated? This condition may be treated with:  Treating the cause, if it is known.  Rest.  Raising (elevating) the joint.  Putting cold or hot packs on the joint.  Medicines to treat symptoms and reduce pain and swelling.  Shots of medicines (cortisone) into the joint. You may also be told to make changes in your life, such as doing exercises and losing weight. Follow these instructions at home: Medicines  Take over-the-counter and prescription medicines only as told by your doctor.  Do not take aspirin for pain if your doctor says that you may have gout. Activity  Rest your joint if your doctor tells you to.  Avoid activities that make the pain worse.  Exercise your joint regularly as told by your doctor. Try doing exercises like: ? Swimming. ? Water aerobics. ? Biking. ? Walking. Managing pain, stiffness, and swelling  If told, put ice on the affected area. ? Put ice in a plastic bag. ? Place a towel between your skin and the bag. ? Leave the ice on for 20 minutes, 2-3 times per day.  If your joint is swollen, raise (elevate) it above the level of your heart  if told by your doctor.  If your joint feels stiff in the morning, try taking a warm shower.  If told, put heat on the affected area. Do this as often as told by your doctor. Use the heat source that your doctor recommends, such as a moist heat pack or a heating pad. If you have diabetes, do not apply heat without asking your doctor. To apply heat: ? Place a towel between your skin and the heat source. ? Leave the heat on for 20-30 minutes. ? Remove the heat if your skin turns bright red. This is very important if you are unable to feel pain, heat, or cold. You may have a greater risk of getting burned.      General instructions  Do not use any products that contain nicotine or tobacco, such as cigarettes, e-cigarettes, and chewing tobacco. If you need help quitting, ask your doctor.  Keep all follow-up visits as told by your doctor. This is important. Contact a doctor if:  The pain gets worse.  You have a fever. Get help right away if:  You have very bad pain in your joint.  You have swelling in your joint.  Your joint is red.  Many joints become painful and swollen.  You have very bad back pain.  Your leg is very weak.  You cannot control your pee (urine) or poop (  stool). Summary  Arthritis means joint pain. It can also mean joint disease. A joint is a place where bones come together.  The most common cause of this condition is wear and tear of a joint.  Symptoms of this condition include redness, swelling, or stiffness of the joint.  This condition is treated with rest, raising the joint, medicines, and putting cold or hot packs on the joint.  Follow your doctor's instructions about medicines, activity, exercises, and other home care treatments. This information is not intended to replace advice given to you by your health care provider. Make sure you discuss any questions you have with your health care provider. Document Revised: 06/14/2018 Document Reviewed:  06/14/2018 Elsevier Patient Education  2021 [employer].

## 2020-08-29 NOTE — Progress Notes (Signed)
Patient ID: Ana Gross, female    DOB: [birth year], 50 y.o.   MRN: [id number]   Chief Complaint  Patient presents with  . swelling in hands for a few weeks- notice more in the morni   Subjective:  CC: hands swelling   This is a new problem.  Presents today with a complaint hand swelling.  Reports that on awakening in the morning both hands are swollen, right is worse than left.  As the morning goes on the swelling decreases, reports that she has a vigorous job she has been doing this job for 2 years, this is never been a problem before.  Symptoms present for 2 weeks.  Also sees a foot doctor for some feet swelling, he reports that she has fasciitis.  Reports that the foot doctors gave her some anti-inflammatories, she does not tolerate those and has not been taking them.  Does report that occasionally when she is driving her right hand feels "numb but she has a history of a bulging disc in her neck.  Her last laboratory work was done 2018.  She also reports that she has 2 fingers that have small red nodules near the distal joints.  Denies fever, chills, chest pain, shortness of breath.    Medical History Jla has a past medical history of Allergy, Dizziness and giddiness (12/24/2012), GERD (gastroesophageal reflux disease), Headache(784.0) (12/24/2012), Heart murmur, Obesity, Reactive airways dysfunction syndrome ([place]), and Vitamin D deficiency.   Outpatient Encounter Medications as of 08/29/2020  Medication Sig  . Acetaminophen (TYLENOL PO) Take by mouth. As needed  . amoxicillin-clavulanate (AUGMENTIN) 875-125 MG tablet Take 1 tablet by mouth 2 (two) times daily. (Patient not taking: Reported on 08/29/2020)  . Aspirin-Acetaminophen-Caffeine (GOODY HEADACHE PO) Take 1 each by mouth as needed.  . Calcium Carbonate-Vitamin D (CALTRATE 600+D PO) Take by mouth daily. 600 D-3  . carboxymethylcellulose (REFRESH PLUS) 0.5 % SOLN 1 drop 3 (three) times daily as needed.  . fexofenadine (ALLEGRA) 180  MG tablet Take 180 mg by mouth daily as needed.  . Fish Oil OIL Take by mouth.  . Multiple Vitamin (MULTIVITAMIN) tablet Take 1 tablet by mouth daily.  . Probiotic Product (PROBIOTIC PEARLS PO) Take by mouth daily.   . Triamcinolone Acetonide (NASACORT ALLERGY 24HR NA) Place into the nose daily.  . vitamin C (ASCORBIC ACID) 500 MG tablet Take 500 mg by mouth daily.   No facility-administered encounter medications on file as of 08/29/2020.     Review of Systems  Constitutional: Negative for chills and fever.  Respiratory: Negative for shortness of breath.   Cardiovascular: Positive for leg swelling. Negative for chest pain.  Gastrointestinal: Negative for abdominal pain.  Musculoskeletal: Positive for neck pain. Negative for back pain.       Bulging disk history     Vitals BP 122/82   Pulse 87   Temp (!) 97 F (36.1 C) (Oral)   Ht 4' 11"  (1.499 m)   Wt 155 lb 6.4 oz (70.5 kg)   SpO2 98%   BMI 31.39 kg/m   Objective:   Physical Exam Vitals reviewed.  Constitutional:      Appearance: Normal appearance.  Cardiovascular:     Rate and Rhythm: Normal rate and regular rhythm.     Heart sounds: Normal heart sounds.  Pulmonary:     Effort: Pulmonary effort is normal.     Breath sounds: Normal breath sounds.  Musculoskeletal:     Right hand: Swelling present.  Left hand: Swelling present.     Right lower leg: Edema present.     Comments: slight swelling today, 2 nodules noted on distal joints of 2 fingers.  Reports symptoms improved as the day goes on.  Skin:    General: Skin is warm and dry.  Neurological:     General: No focal deficit present.     Mental Status: She is alert.  Psychiatric:        Behavior: Behavior normal.      Assessment and Plan   1. Swelling of finger of both hands - CBC with Differential - Comprehensive Metabolic Panel (CMET) - Sed Rate (ESR) - C-reactive protein - Rheumatoid Factor   She will get labs today, she has not had labs since  2018.  She will watch her sodium in her diet, she will follow-up in 2 weeks.  If labs are negative for inflammatory process, will consider short term diuretic use.  She did feel some tingling in the tips of her fingers during the Phalen maneuver.  Considering carpal tunnel, wrist brace.  Differential diagnoses include: Rheumatoid arthritis, osteoarthritis, carpal tunnel syndrome, edema related to occupation.  Agrees with plan of care discussed today. Understands warning signs to seek further care: chest pain, shortness of breath, any significant change in health.  Understands to follow-up in 2 weeks.  Will notify if anything abnormal on labs prior to that visit.  She gets her annual wellness through her GYN.  Will review labs at 2-week follow-up, consider wrist brace, specialist referral if needed, depending on lab results.  Pecolia Ades, NP 08/29/20

## 2020-08-30 LAB — COMPREHENSIVE METABOLIC PANEL
ALT: 20 IU/L (ref 0–32)
AST: 19 IU/L (ref 0–40)
Albumin/Globulin Ratio: 2.1 (ref 1.2–2.2)
Albumin: 4.6 g/dL (ref 3.8–4.8)
Alkaline Phosphatase: 124 IU/L — ABNORMAL HIGH (ref 44–121)
BUN/Creatinine Ratio: 15 (ref 9–23)
BUN: 11 mg/dL (ref 6–24)
Bilirubin Total: 0.3 mg/dL (ref 0.0–1.2)
CO2: 23 mmol/L (ref 20–29)
Calcium: 9.4 mg/dL (ref 8.7–10.2)
Chloride: 101 mmol/L (ref 96–106)
Creatinine, Ser: 0.71 mg/dL (ref 0.57–1.00)
GFR calc Af Amer: 116 mL/min/{1.73_m2} (ref >59)
GFR calc non Af Amer: 100 mL/min/{1.73_m2} (ref >59)
Globulin, Total: 2.2 g/dL (ref 1.5–4.5)
Glucose: 80 mg/dL (ref 65–99)
Potassium: 4.3 mmol/L (ref 3.5–5.2)
Sodium: 140 mmol/L (ref 134–144)
Total Protein: 6.8 g/dL (ref 6.0–8.5)

## 2020-08-30 LAB — CBC WITH DIFFERENTIAL/PLATELET
Basophils Absolute: 0 10*3/uL (ref 0.0–0.2)
Basos: 1 %
EOS (ABSOLUTE): 0.1 10*3/uL (ref 0.0–0.4)
Eos: 3 %
Hematocrit: 41.2 % (ref 34.0–46.6)
Hemoglobin: 13.7 g/dL (ref 11.1–15.9)
Immature Grans (Abs): 0 10*3/uL (ref 0.0–0.1)
Immature Granulocytes: 0 %
Lymphocytes Absolute: 1.6 10*3/uL (ref 0.7–3.1)
Lymphs: 31 %
MCH: 30.4 pg (ref 26.6–33.0)
MCHC: 33.3 g/dL (ref 31.5–35.7)
MCV: 91 fL (ref 79–97)
Monocytes Absolute: 0.3 10*3/uL (ref 0.1–0.9)
Monocytes: 6 %
Neutrophils Absolute: 3 10*3/uL (ref 1.4–7.0)
Neutrophils: 59 %
Platelets: 259 10*3/uL (ref 150–450)
RBC: 4.51 x10E6/uL (ref 3.77–5.28)
RDW: 12.7 % (ref 11.7–15.4)
WBC: 5.1 10*3/uL (ref 3.4–10.8)

## 2020-08-30 LAB — RHEUMATOID FACTOR: Rhuematoid fact SerPl-aCnc: <10 IU/mL (ref <14.0)

## 2020-08-30 LAB — SEDIMENTATION RATE: Sed Rate: 3 mm/hr (ref 0–32)

## 2020-08-30 LAB — C-REACTIVE PROTEIN: CRP: <1 mg/L (ref 0–10)

## 2020-09-12 ENCOUNTER — Encounter: Payer: 59 | Admitting: Family Medicine

## 2020-09-19 ENCOUNTER — Encounter: Payer: Self-pay | Admitting: Family Medicine

## 2020-09-19 ENCOUNTER — Other Ambulatory Visit: Payer: Self-pay

## 2020-09-19 ENCOUNTER — Ambulatory Visit: Payer: 59 | Admitting: Family Medicine

## 2020-09-19 VITALS — BP 135/84 | HR 77 | Temp 97.6°F | Wt 156.0 lb

## 2020-09-19 DIAGNOSIS — M7989 Other specified soft tissue disorders: Secondary | ICD-10-CM

## 2020-09-19 DIAGNOSIS — R2233 Localized swelling, mass and lump, upper limb, bilateral: Secondary | ICD-10-CM

## 2020-09-19 NOTE — Progress Notes (Signed)
Patient ID: Ana Gross, female    DOB: 10-13-1970, 50 y.o.   MRN: [id number]   Chief Complaint  Patient presents with  . Follow up  . Arthritis   Subjective:    HPI Pt here to discuss recent lab work for arthritis. Pt was seen 08/29/20 by Santiago Glad for swelling of finger.   Rt middle finger and left index finger.  Swelling intermittent and comes and goes. Non tender nodules.  Not having RA or infectious.  Dad has bad arhtritis, OA. And deg disc disease.  occ bright red nodules.  occ having swelling in feet has plantar fasciitis and seeing foot doc.  Getting injections.   Has stomach pain with nsaids.  Medical History Ana Gross has a past medical history of Allergy, Dizziness and giddiness (12/24/2012), GERD (gastroesophageal reflux disease), Headache(784.0) (12/24/2012), Heart murmur, Obesity, Reactive airways dysfunction syndrome ([place]), and Vitamin D deficiency.   Outpatient Encounter Medications as of 09/19/2020  Medication Sig  . Acetaminophen (TYLENOL PO) Take by mouth. As needed  . Aspirin-Acetaminophen-Caffeine (GOODY HEADACHE PO) Take 1 each by mouth as needed.  . Calcium Carbonate-Vitamin D (CALTRATE 600+D PO) Take by mouth daily. 600 D-3  . carboxymethylcellulose (REFRESH PLUS) 0.5 % SOLN 1 drop 3 (three) times daily as needed.  . fexofenadine (ALLEGRA) 180 MG tablet Take 180 mg by mouth daily as needed.  . Fish Oil OIL Take by mouth.  . Multiple Vitamin (MULTIVITAMIN) tablet Take 1 tablet by mouth daily.  . Probiotic Product (PROBIOTIC PEARLS PO) Take by mouth daily.   . Triamcinolone Acetonide (NASACORT ALLERGY 24HR NA) Place into the nose daily.  . vitamin C (ASCORBIC ACID) 500 MG tablet Take 500 mg by mouth daily.  . [DISCONTINUED] amoxicillin-clavulanate (AUGMENTIN) 875-125 MG tablet Take 1 tablet by mouth 2 (two) times daily. (Patient not taking: Reported on 08/29/2020)   No facility-administered encounter medications on file as of 09/19/2020.     Review of Systems   Constitutional: Negative for chills and fever.  HENT: Negative for congestion, rhinorrhea and sore throat.   Respiratory: Negative for cough, shortness of breath and wheezing.   Cardiovascular: Negative for chest pain and leg swelling.  Gastrointestinal: Negative for abdominal pain, diarrhea, nausea and vomiting.  Genitourinary: Negative for dysuria and frequency.  Musculoskeletal: Negative for arthralgias and back pain.       Finger pain  Skin: Negative for rash.  Neurological: Negative for dizziness, weakness and headaches.     Vitals BP 135/84   Pulse 77   Temp 97.6 F (36.4 C)   Wt 156 lb (70.8 kg)   SpO2 100%   BMI 31.51 kg/m   Objective:   Physical Exam Vitals and nursing note reviewed.  Constitutional:      Appearance: Normal appearance.  HENT:     Head: Normocephalic and atraumatic.     Nose: Nose normal.     Mouth/Throat:     Mouth: Mucous membranes are moist.     Pharynx: Oropharynx is clear.  Eyes:     Extraocular Movements: Extraocular movements intact.     Conjunctiva/sclera: Conjunctivae normal.     Pupils: Pupils are equal, round, and reactive to light.  Cardiovascular:     Rate and Rhythm: Normal rate and regular rhythm.     Pulses: Normal pulses.     Heart sounds: Normal heart sounds.  Pulmonary:     Effort: Pulmonary effort is normal.     Breath sounds: Normal breath sounds. No wheezing, rhonchi or rales.  Musculoskeletal:        General: Tenderness (bilateral fingers) present. Normal range of motion.     Right lower leg: No edema.     Left lower leg: No edema.     Comments: +small erythematous nodule on rt middle finger and left index finger.  Skin:    General: Skin is warm and dry.     Findings: No lesion or rash.  Neurological:     General: No focal deficit present.     Mental Status: She is alert and oriented to person, place, and time.  Psychiatric:        Mood and Affect: Mood normal.        Behavior: Behavior normal.     Assessment and Plan   1. Swelling of finger of both hands  2. Nodule of finger of both hands   Reviewed labs and no sign of RA or inflammation on labs. Likely heberden's nodes on her fingers likely OA.  Recommending tylenol prn pain.   Return if symptoms worsen or fail to improve.

## 2020-10-08 ENCOUNTER — Other Ambulatory Visit: Payer: Self-pay | Admitting: Emergency Medicine

## 2020-10-08 ENCOUNTER — Ambulatory Visit
Admission: RE | Admit: 2020-10-08 | Discharge: 2020-10-08 | Disposition: A | Discharge status: Home / Self Care | Payer: Worker's Compensation | Source: Ambulatory Visit | Attending: Emergency Medicine | Admitting: Emergency Medicine

## 2020-10-08 DIAGNOSIS — T1490XA Injury, unspecified, initial encounter: Secondary | ICD-10-CM

## 2020-10-12 ENCOUNTER — Encounter: Payer: Self-pay | Admitting: Podiatry

## 2020-10-12 ENCOUNTER — Ambulatory Visit: Payer: 59 | Admitting: Podiatry

## 2020-10-12 ENCOUNTER — Other Ambulatory Visit: Payer: Self-pay

## 2020-10-12 DIAGNOSIS — S9031XA Contusion of right foot, initial encounter: Secondary | ICD-10-CM

## 2020-10-12 DIAGNOSIS — M722 Plantar fascial fibromatosis: Secondary | ICD-10-CM | POA: Diagnosis not present

## 2020-10-12 MED ORDER — TRIAMCINOLONE ACETONIDE 10 MG/ML IJ SUSP
10.0000 mg | Freq: Once | INTRAMUSCULAR | Status: AC
  Administered 2020-10-12: 10 mg

## 2020-10-14 NOTE — Progress Notes (Signed)
Subjective:   Patient ID: Ana Gross, female   DOB: 50 y.o.   MRN: [id number]   HPI Patient states she is still having chronic pain in her heel right over left and states that the orthotics are no longer helping her as much and feels like they are sliding and that was the only way she was able to walk previously.  She would like to avoid surgery if possible and is also had trauma to her right forefoot and did drop something on her right forefoot with bruising swelling noted today   ROS      Objective:  Physical Exam  Neurovascular status unchanged with exquisite discomfort plantar heel region bilateral inflammation fluid buildup and forefoot swelling right with bruising of the forefoot noted     Assessment:  Acute plantar fasciitis bilateral present with orthotics that are no longer providing good function along with traumatized right forefoot     Plan:  H&P reviewed both conditions separately and for the heels I did do sterile prep injected the fascia 3 mg Kenalog 5 mg Xylocaine and I did cast for a new orthotic to support and how the arch with softer materials.  I then went ahead and I reviewed her forefoot but I do agree with ice therapy reduced activity and anti-inflammatories

## 2020-10-22 ENCOUNTER — Encounter: Payer: Self-pay | Admitting: Emergency Medicine

## 2020-10-22 ENCOUNTER — Ambulatory Visit
Admission: EM | Admit: 2020-10-22 | Discharge: 2020-10-22 | Disposition: A | Discharge status: Home / Self Care | Payer: 59 | Attending: Family Medicine | Admitting: Family Medicine

## 2020-10-22 ENCOUNTER — Telehealth: Payer: Self-pay

## 2020-10-22 ENCOUNTER — Other Ambulatory Visit: Payer: Self-pay

## 2020-10-22 DIAGNOSIS — R112 Nausea with vomiting, unspecified: Secondary | ICD-10-CM

## 2020-10-22 DIAGNOSIS — R198 Other specified symptoms and signs involving the digestive system and abdomen: Secondary | ICD-10-CM

## 2020-10-22 DIAGNOSIS — R1013 Epigastric pain: Secondary | ICD-10-CM

## 2020-10-22 DIAGNOSIS — R140 Abdominal distension (gaseous): Secondary | ICD-10-CM

## 2020-10-22 MED ORDER — POLYETHYLENE GLYCOL 3350 17 G PO PACK: 17.0000 g | PACK | Freq: Two times a day (BID) | ORAL | 0 refills | Status: DC

## 2020-10-22 MED ORDER — FAMOTIDINE 40 MG PO TABS: 40.0000 mg | ORAL_TABLET | Freq: Two times a day (BID) | ORAL | 0 refills | Status: DC

## 2020-10-22 MED ORDER — ONDANSETRON HCL 4 MG PO TABS: 4.0000 mg | ORAL_TABLET | Freq: Four times a day (QID) | ORAL | 0 refills | Status: DC

## 2020-10-22 MED ORDER — DICYCLOMINE HCL 20 MG PO TABS: 20.0000 mg | ORAL_TABLET | Freq: Two times a day (BID) | ORAL | 0 refills | Status: DC

## 2020-10-22 NOTE — Telephone Encounter (Signed)
Pt has been vomiting Saturday night and Sunday morning and she has only eat applesauce and toast her stomach is stomach is still hurting and it feels like she has acid reflux.   Pt call back [phone]

## 2020-10-22 NOTE — Telephone Encounter (Signed)
Pt states she had a pain in her stomach that felt like someone punched her and then started having vomiting Saturday night into Sunday moring. No vomiting since Sunday morning. Did have fever yesterday none today. Has ate crackers and applesauce today and keeping down liquids. States she is still having constant pain all over abdomen that feels like someone is punching her. States the pain is better today also having a headache today. Pt advised to go to urgent care since no available appts today.

## 2020-10-22 NOTE — ED Provider Notes (Addendum)
[facility]    CSN: [id number] Arrival date & time: 10/22/20  1210      History   Chief Complaint No chief complaint on file.   HPI Ana Gross is a 50 y.o. female.   HPI  Mid-epigastric abdominal pain x 3 days. History of GERD which has been controlled with diet.  Headache x 1 day, endorses poor oral fluids and solid food intake.  Passing gas, vomited x 2 days ago and continues to experience mild nausea. Patient endorses irregular bowel patterns of constipation and frequent stools. Constipation is present symptom.  No sick contacts and no fever.   Past Medical History:  Diagnosis Date  . Allergy   . Dizziness and giddiness 12/24/2012  . GERD (gastroesophageal reflux disease)   . Headache(784.0) 12/24/2012  . Heart murmur   . Obesity   . Reactive airways dysfunction syndrome ([place])   . Vitamin D deficiency     Patient Active Problem List   Diagnosis Date Noted  . Acquired short Achilles tendon of right lower extremity 06/22/2019  . Tendonitis, Achilles, right 06/22/2019  . Calcaneal spur of right foot 05/05/2019  . Pain in right foot 05/05/2019  . Swelling of finger of both hands 01/07/2018  . Headache(784.0) 12/24/2012  . Heart murmur     Past Surgical History:  Procedure Laterality Date  . ENDOMETRIAL ABLATION    . FOOT SURGERY Left 2006  . TONSILLECTOMY AND ADENOIDECTOMY      OB History   No obstetric history on file.      Home Medications    Prior to Admission medications   Medication Sig Start Date End Date Taking? Authorizing Provider  Acetaminophen (TYLENOL PO) Take by mouth. As needed    [provider]  Aspirin-Acetaminophen-Caffeine (GOODY HEADACHE PO) Take 1 each by mouth as needed.    [provider]  Calcium Carbonate-Vitamin D (CALTRATE 600+D PO) Take by mouth daily. 600 D-3    [provider]  carboxymethylcellulose (REFRESH PLUS) 0.5 % SOLN 1 drop 3 (three) times daily as needed.    [provider]  Fish Oil OIL Take by mouth.    [provider]  Multiple Vitamin (MULTIVITAMIN) tablet Take 1 tablet by mouth daily.    [provider]  Probiotic Product (PROBIOTIC PEARLS PO) Take by mouth daily.     [provider]  Triamcinolone Acetonide (NASACORT ALLERGY 24HR NA) Place into the nose daily.    [provider]  vitamin C (ASCORBIC ACID) 500 MG tablet Take 500 mg by mouth daily.    [provider]    Family History Family History  Problem Relation Age of Onset  . COPD Mother   . Diabetes Mother   . Diabetes Father   . Hypertension Father   . Coronary artery disease Father   . COPD Father   . Arthritis Father     Social History Social History   Tobacco Use  . Smoking status: Never Smoker  . Smokeless tobacco: Never Used  Vaping Use  . Vaping Use: Never used  Substance Use Topics  . Alcohol use: Yes    Comment: she drinks about once or twice a year.  . Drug use: No     Allergies   Other, Iodinated diagnostic agents, Montelukast, Cefzil [cefprozil], and Singulair [montelukast sodium]   Review of Systems Review of Systems Pertinent negatives listed in HPI   Physical Exam Triage Vital Signs ED Triage Vitals [10/22/20 1239]  Enc Vitals  Group     BP 131/86     Pulse Rate 96     Resp 18     Temp 98.2 F (36.8 C)     Temp Source Oral     SpO2 96 %     Weight      Height      Head Circumference      Peak Flow      Pain Score 6     Pain Loc      Pain Edu?      Excl. in [place]?    No data found.  Updated Vital Signs BP 131/86 (BP Location: Right Arm)   Pulse 96   Temp 98.2 F (36.8 C) (Oral)   Resp 18   SpO2 96%   Visual Acuity Right Eye Distance:   Left Eye Distance:   Bilateral Distance:    Right Eye Near:   Left Eye Near:    Bilateral Near:     Physical Exam General appearance: Alert, acutely ill-appearing,  Cooperative, without distress Head: Normocephalic, without obvious  abnormality, atraumatic Respiratory: Respirations even and unlabored, normal respiratory rate Heart: rate and rhythm normal. No gallop or murmurs noted on exam  Abdomen: BS hyperactive , mild distention present , no rebound tenderness, or no mass Extremities: No gross deformities Skin: Skin color, texture, turgor normal. No rashes seen  Psych: Appropriate mood and affect. Neurologic: Mental status: Alert, oriented to person, place, and time, thought content appropriate.  UC Treatments / Results  Labs (all labs ordered are listed, but only abnormal results are displayed) Labs Reviewed - No data to display  EKG   Radiology No results found.  Procedures Procedures (including critical care time)  Medications Ordered in UC Medications - No data to display  Initial Impression / Assessment and Plan / UC Course  I have reviewed the triage vital signs and the nursing notes.  Pertinent labs & imaging results that were available during my care of the patient were reviewed by me and considered in my medical decision making (see chart for details).    Epigastric pain with abdominal bloating, likely exacerbated by constipation.  Symptoms are concern for possible IBS, however patient has no formal diagnosis of IBS. No peritoneal signs, no concern at present for an acute abdomen . Symptom management per discharge medications. Encouraged GI evaluation and colonoscopy (current guidelines 45 yrs and older for preventative screening)  if these symptoms persistent. ER precautions given Final Clinical Impressions(s) / UC Diagnoses   Final diagnoses:  Epigastric pain  Abdominal bloating  Irregular bowel habits  Nausea and vomiting, intractability of vomiting not specified, unspecified vomiting type     Discharge Instructions     If symptoms worsen or become severe, go immediately to the nearest Emergency Department.    ED Prescriptions    Medication Sig Dispense Auth. Provider    famotidine (PEPCID) 40 MG tablet Take 1 tablet (40 mg total) by mouth 2 (two) times daily for 7 days. 14 tablet Scot Jun, FNP   ondansetron (ZOFRAN) 4 MG tablet Take 1 tablet (4 mg total) by mouth every 6 (six) hours. 12 tablet Scot Jun, FNP   dicyclomine (BENTYL) 20 MG tablet Take 1 tablet (20 mg total) by mouth 2 (two) times daily. 20 tablet Scot Jun, FNP   polyethylene glycol (MIRALAX) 17 g packet Take 17 g by mouth 2 (two) times daily. 14 each Scot Jun, FNP     PDMP not reviewed  this encounter.   Scot Jun, FNP 10/26/20 2231    Scot Jun, [place] 10/26/20 2232

## 2020-10-22 NOTE — ED Triage Notes (Signed)
abd pain started on Saturday night, vomiting and headache.

## 2020-10-22 NOTE — Discharge Instructions (Addendum)
If symptoms worsen or become severe, go immediately to the nearest Emergency Department.

## 2020-11-12 ENCOUNTER — Encounter (INDEPENDENT_AMBULATORY_CARE_PROVIDER_SITE_OTHER): Payer: Self-pay | Admitting: *Deleted

## 2020-11-12 ENCOUNTER — Ambulatory Visit: Payer: 59 | Admitting: Family Medicine

## 2020-11-12 ENCOUNTER — Other Ambulatory Visit: Payer: Self-pay

## 2020-11-12 ENCOUNTER — Encounter: Payer: Self-pay | Admitting: Family Medicine

## 2020-11-12 VITALS — BP 118/78 | HR 91 | Temp 97.2°F | Ht 59.0 in | Wt 152.0 lb

## 2020-11-12 DIAGNOSIS — Z1211 Encounter for screening for malignant neoplasm of colon: Secondary | ICD-10-CM | POA: Diagnosis not present

## 2020-11-12 DIAGNOSIS — R198 Other specified symptoms and signs involving the digestive system and abdomen: Secondary | ICD-10-CM

## 2020-11-12 DIAGNOSIS — K219 Gastro-esophageal reflux disease without esophagitis: Secondary | ICD-10-CM | POA: Diagnosis not present

## 2020-11-12 DIAGNOSIS — R194 Change in bowel habit: Secondary | ICD-10-CM | POA: Diagnosis not present

## 2020-11-12 MED ORDER — FAMOTIDINE 40 MG PO TABS: 40.0000 mg | ORAL_TABLET | Freq: Every day | ORAL | 1 refills | Status: DC

## 2020-11-12 NOTE — Patient Instructions (Signed)
Low-FODMAP Eating Plan  FODMAP stands for fermentable oligosaccharides, disaccharides, monosaccharides, and polyols. These are sugars that are hard for some people to digest. A low-FODMAP eating plan may help some people who have irritable bowel syndrome (IBS) and certain other bowel (intestinal) diseases to manage their symptoms. This meal plan can be complicated to follow. Work with a diet and nutrition specialist (dietitian) to make a low-FODMAP eating plan that is right for you. A dietitian can help make sure that you get enough nutrition from this diet. What are tips for following this plan? Reading food labels  Check labels for hidden FODMAPs such as: ? High-fructose syrup. ? Honey. ? Agave. ? Natural fruit flavors. ? Onion or garlic powder.  Choose low-FODMAP foods that contain 3-4 grams of fiber per serving.  Check food labels for serving sizes. Eat only one serving at a time to make sure FODMAP levels stay low. Shopping  Shop with a list of foods that are recommended on this diet and make a meal plan. Meal planning  Follow a low-FODMAP eating plan for up to 6 weeks, or as told by your health care provider or dietitian.  To follow the eating plan: 1. Eliminate high-FODMAP foods from your diet completely. Choose only low-FODMAP foods to eat. You will do this for 2-6 weeks. 2. Gradually reintroduce high-FODMAP foods into your diet one at a time. Most people should wait a few days before introducing the next new high-FODMAP food into their meal plan. Your dietitian can recommend how quickly you may reintroduce foods. 3. Keep a daily record of what and how much you eat and drink. Make note of any symptoms that you have after eating. 4. Review your daily record with a dietitian regularly to identify which foods you can eat and which foods you should avoid. General tips  Drink enough fluid each day to keep your urine pale yellow.  Avoid processed foods. These often have added sugar  and may be high in FODMAPs.  Avoid most dairy products, whole grains, and sweeteners.  Work with a dietitian to make sure you get enough fiber in your diet.  Avoid high FODMAP foods at meals to manage symptoms. Recommended foods Fruits Bananas, oranges, tangerines, lemons, limes, blueberries, raspberries, strawberries, grapes, cantaloupe, honeydew melon, kiwi, papaya, passion fruit, and pineapple. Limited amounts of dried cranberries, banana chips, and shredded coconut. Vegetables Eggplant, zucchini, cucumber, peppers, green beans, bean sprouts, lettuce, arugula, kale, Swiss chard, spinach, collard greens, bok choy, summer squash, potato, and tomato. Limited amounts of corn, carrot, and sweet potato. Green parts of scallions. Grains Gluten-free grains, such as rice, oats, buckwheat, quinoa, corn, polenta, and millet. Gluten-free pasta, bread, or cereal. Rice noodles. Corn tortillas. Meats and other proteins Unseasoned beef, pork, poultry, or fish. Eggs. Berniece Salines. Tofu (firm) and tempeh. Limited amounts of nuts and seeds, such as almonds, walnuts, [place] nuts, pecans, peanuts, nut butters, pumpkin seeds, chia seeds, and sunflower seeds. Dairy Lactose-free milk, yogurt, and kefir. Lactose-free cottage cheese and ice cream. Non-dairy milks, such as almond, coconut, hemp, and rice milk. Non-dairy yogurt. Limited amounts of goat cheese, brie, mozzarella, parmesan, swiss, and other hard cheeses. Fats and oils Butter-free spreads. Vegetable oils, such as olive, canola, and sunflower oil. Seasoning and other foods Artificial sweeteners with names that do not end in "ol," such as aspartame, saccharine, and stevia. Maple syrup, white table sugar, raw sugar, brown sugar, and molasses. Mayonnaise, soy sauce, and tamari. Fresh basil, coriander, parsley, rosemary, and thyme. Beverages Water and  mineral water. Sugar-sweetened soft drinks. Small amounts of orange juice or cranberry juice. Black and green tea.  Most dry wines. Coffee. The items listed above may not be a complete list of foods and beverages you can eat. Contact a dietitian for more information. Foods to avoid Fruits Fresh, dried, and juiced forms of apple, pear, watermelon, peach, plum, cherries, apricots, blackberries, boysenberries, figs, nectarines, and mango. Avocado. Vegetables Chicory root, artichoke, asparagus, cabbage, snow peas, Brussels sprouts, broccoli, sugar snap peas, mushrooms, celery, and cauliflower. Onions, garlic, leeks, and the white part of scallions. Grains Wheat, including kamut, durum, and semolina. Barley and bulgur. Couscous. Wheat-based cereals. Wheat noodles, bread, crackers, and pastries. Meats and other proteins Fried or fatty meat. Sausage. Cashews and pistachios. Soybeans, baked beans, black beans, chickpeas, kidney beans, fava beans, navy beans, lentils, black-eyed peas, and split peas. Dairy Milk, yogurt, ice cream, and soft cheese. Cream and sour cream. Milk-based sauces. Custard. Buttermilk. Soy milk. Seasoning and other foods Any sugar-free gum or candy. Foods that contain artificial sweeteners such as sorbitol, mannitol, isomalt, or xylitol. Foods that contain honey, high-fructose corn syrup, or agave. Bouillon, vegetable stock, beef stock, and chicken stock. Garlic and onion powder. Condiments made with onion, such as hummus, chutney, pickles, relish, salad dressing, and salsa. Tomato paste. Beverages Chicory-based drinks. Coffee substitutes. Chamomile tea. Fennel tea. Sweet or fortified wines such as port or sherry. Diet soft drinks made with isomalt, mannitol, maltitol, sorbitol, or xylitol. Apple, pear, and mango juice. Juices with high-fructose corn syrup. The items listed above may not be a complete list of foods and beverages you should avoid. Contact a dietitian for more information. Summary  FODMAP stands for fermentable oligosaccharides, disaccharides, monosaccharides, and polyols. These  are sugars that are hard for some people to digest.  A low-FODMAP eating plan is a short-term diet that helps to ease symptoms of certain bowel diseases.  The eating plan usually lasts up to 6 weeks. After that, high-FODMAP foods are reintroduced gradually and one at a time. This can help you find out which foods may be causing symptoms.  A low-FODMAP eating plan can be complicated. It is best to work with a dietitian who has experience with this type of plan. This information is not intended to replace advice given to you by your health care provider. Make sure you discuss any questions you have with your health care provider. Document Revised: 11/24/2019 Document Reviewed: 11/24/2019 Elsevier Patient Education  [place].

## 2020-11-12 NOTE — Progress Notes (Signed)
Patient ID: Ana Gross, female    DOB: 1971-01-13, 50 y.o.   MRN: [id number]   Chief Complaint  Patient presents with  . upper abd     Follow up from urgent care which comes and goes   Subjective:    HPI  Pt seen for f/u on epigastric pain.  10/22/20- [facility] with epigastric pain. After dinner. 2-3 hrs after.  Hurting pain in stomach. Then vomiting until next morning.  Fever next day, then vomiting had stopped. No diarrhea. 2 days later, weak, stomach pain, and headache.  Bloated and gurgling in lower abd.  bm are irregular and go 2-3 days w/o going. Pt given miralax.  Given zofran, bentyl, miralax, and pepcid.  Out of work for 3 days.   Feeling better now, but not exactly back to normal.  Comes and goes with pain in stomach. Hearing some gurgling in the lower abd, not painful.  Since taking miralax, normal color of stool, but then wiping some bright yellow. occ looking like mucous. Saw someone with GI for reflux, and stopped soda and taking meds for reflux, then stopped.   Medical History Ana Gross has a past medical history of Allergy, Dizziness and giddiness (12/24/2012), GERD (gastroesophageal reflux disease), Headache(784.0) (12/24/2012), Heart murmur, Obesity, Reactive airways dysfunction syndrome ([facility]), and Vitamin D deficiency.   Outpatient Encounter Medications as of 11/12/2020  Medication Sig  . Acetaminophen (TYLENOL PO) Take by mouth. As needed  . Aspirin-Acetaminophen-Caffeine (GOODY HEADACHE PO) Take 1 each by mouth as needed.  . Calcium Carbonate-Vitamin D (CALTRATE 600+D PO) Take by mouth daily. 600 D-3  . carboxymethylcellulose (REFRESH PLUS) 0.5 % SOLN 1 drop 3 (three) times daily as needed.  . dicyclomine (BENTYL) 20 MG tablet Take 1 tablet (20 mg total) by mouth 2 (two) times daily.  . famotidine (PEPCID) 40 MG tablet Take 1 tablet (40 mg total) by mouth daily.  . Fish Oil OIL Take by mouth.  . Multiple Vitamin (MULTIVITAMIN) tablet Take 1  tablet by mouth daily.  . ondansetron (ZOFRAN) 4 MG tablet Take 1 tablet (4 mg total) by mouth every 6 (six) hours.  . polyethylene glycol (MIRALAX) 17 g packet Take 17 g by mouth 2 (two) times daily.  . Probiotic Product (PROBIOTIC PEARLS PO) Take by mouth daily.   . Triamcinolone Acetonide (NASACORT ALLERGY 24HR NA) Place into the nose daily.  . vitamin C (ASCORBIC ACID) 500 MG tablet Take 500 mg by mouth daily.  . [DISCONTINUED] famotidine (PEPCID) 40 MG tablet Take 1 tablet (40 mg total) by mouth 2 (two) times daily for 7 days.   No facility-administered encounter medications on file as of 11/12/2020.     Review of Systems  Constitutional: Negative for chills and fever.  HENT: Negative for congestion, rhinorrhea and sore throat.   Respiratory: Negative for cough, shortness of breath and wheezing.   Cardiovascular: Negative for chest pain and leg swelling.  Gastrointestinal: Positive for abdominal pain (epigastric) and vomiting (resolved). Negative for diarrhea and nausea.       +bloating, gurgling  Genitourinary: Negative for dysuria and frequency.  Musculoskeletal: Negative for arthralgias and back pain.  Skin: Negative for rash.  Neurological: Negative for dizziness, weakness and headaches.     Vitals BP 118/78   Pulse 91   Temp (!) 97.2 F (36.2 C)   Ht 4\' 11"  (1.499 m)   Wt 152 lb (68.9 kg)   SpO2 97%   BMI 30.70 kg/m   Objective:  Physical Exam Vitals and nursing note reviewed.  Constitutional:      General: She is not in acute distress.    Appearance: Normal appearance. She is not ill-appearing.  HENT:     Head: Normocephalic and atraumatic.     Nose: Nose normal.     Mouth/Throat:     Mouth: Mucous membranes are moist.     Pharynx: Oropharynx is clear.  Eyes:     Extraocular Movements: Extraocular movements intact.     Conjunctiva/sclera: Conjunctivae normal.     Pupils: Pupils are equal, round, and reactive to light.  Cardiovascular:     Rate and  Rhythm: Normal rate and regular rhythm.     Pulses: Normal pulses.     Heart sounds: Normal heart sounds.  Pulmonary:     Effort: Pulmonary effort is normal.     Breath sounds: Normal breath sounds. No wheezing, rhonchi or rales.  Abdominal:     General: Abdomen is flat. Bowel sounds are normal. There is no distension.     Palpations: Abdomen is soft. There is no mass.     Tenderness: There is no abdominal tenderness. There is no guarding or rebound.     Hernia: No hernia is present.  Musculoskeletal:        General: Normal range of motion.     Right lower leg: No edema.     Left lower leg: No edema.  Skin:    General: Skin is warm and dry.     Findings: No lesion or rash.  Neurological:     General: No focal deficit present.     Mental Status: She is alert and oriented to person, place, and time.  Psychiatric:        Mood and Affect: Mood normal.        Behavior: Behavior normal.      Assessment and Plan   1. Irregular bowel habits - Ambulatory referral to Gastroenterology  2. Colon cancer screening - Ambulatory referral to Gastroenterology  3. Recent change in frequency of bowel movements - Ambulatory referral to Gastroenterology  4. Gastroesophageal reflux disease, unspecified whether esophagitis present - famotidine (PEPCID) 40 MG tablet; Take 1 tablet (40 mg total) by mouth daily.  Dispense: 30 tablet; Refill: 1   Symptoms sound like reflux and concerning for IBS, mixed.  Pt to take bentyl prn, gave low fodmap handout to see if can change diet to dec the gurgling and abd discomfort. Cont pepcid daily and avoid greasy, spicy, alcohol and caffeine. Call if worsening pain.  Gave referral to GI.     Return if symptoms worsen or fail to improve.

## 2020-11-14 ENCOUNTER — Encounter: Payer: Self-pay | Admitting: Podiatry

## 2020-11-14 ENCOUNTER — Other Ambulatory Visit: Payer: Self-pay

## 2020-11-14 ENCOUNTER — Ambulatory Visit: Payer: 59 | Admitting: Podiatry

## 2020-11-14 DIAGNOSIS — M722 Plantar fascial fibromatosis: Secondary | ICD-10-CM | POA: Diagnosis not present

## 2020-11-14 DIAGNOSIS — M79671 Pain in right foot: Secondary | ICD-10-CM

## 2020-11-14 DIAGNOSIS — M779 Enthesopathy, unspecified: Secondary | ICD-10-CM | POA: Diagnosis not present

## 2020-11-16 NOTE — Progress Notes (Signed)
Subjective:   Patient ID: Ana Gross, female   DOB: 50 y.o.   MRN: [id number]   HPI Patient states that she still is having discomfort in her right foot but it is gradually improving and she is looking forward to her orthotics but has numerous questions concerning long-term knee   ROS      Objective:  Physical Exam  Neurovascular status intact with patient found to have inflammation still in the plantar heel that has moderated still present with discomfort also in the forefoot and medial foot.  Patient has good digital perfusion still noted     Assessment:  Chronic inflammatory condition with moderate improvement with hope that orthotics will give her final relief     Plan:  H&P reviewed condition at great length discussed continued treatment options topical or oral medications orthotics which were fitted today and stretching exercises with shoe gear modification.  Discussed other possible treatments at 1 point in future but we will hold off continue to treat conservatively and patient will be seen back for reevaluation as needed

## 2021-01-17 ENCOUNTER — Ambulatory Visit
Admission: EM | Admit: 2021-01-17 | Discharge: 2021-01-17 | Disposition: A | Discharge status: Home / Self Care | Payer: 59 | Attending: Emergency Medicine | Admitting: Emergency Medicine

## 2021-01-17 ENCOUNTER — Encounter: Payer: Self-pay | Admitting: Family Medicine

## 2021-01-17 ENCOUNTER — Encounter: Payer: Self-pay | Admitting: Emergency Medicine

## 2021-01-17 DIAGNOSIS — W57XXXA Bitten or stung by nonvenomous insect and other nonvenomous arthropods, initial encounter: Secondary | ICD-10-CM | POA: Diagnosis not present

## 2021-01-17 DIAGNOSIS — S20162A Insect bite (nonvenomous) of breast, left breast, initial encounter: Secondary | ICD-10-CM

## 2021-01-17 MED ORDER — MUPIROCIN CALCIUM 2 % EX CREA: 1.0000 "application " | TOPICAL_CREAM | Freq: Two times a day (BID) | CUTANEOUS | 0 refills | Status: DC

## 2021-01-17 MED ORDER — DOXYCYCLINE HYCLATE 100 MG PO CAPS: 100.0000 mg | ORAL_CAPSULE | Freq: Two times a day (BID) | ORAL | 0 refills | Status: DC

## 2021-01-17 NOTE — ED Triage Notes (Signed)
Insect bite on Tuesday on left breast.  Area is itchy and red

## 2021-01-17 NOTE — ED Provider Notes (Signed)
[facility]   [id number] 01/17/21 Arrival Time: 1032   [id number]  SUBJECTIVE:  Ana Gross is a 50 y.o. female who presents with a possible insect bite to LT breast x 2 days.  Unsure what bit her.  Tried expressing with clear drainage.  Has been keeping clean, but did not try OTC neosporin.  Denies similar symptoms.  Denies fever, chills, nausea, vomiting, redness, swelling.    ROS: As per HPI.  All other pertinent ROS negative.     Past Medical History:  Diagnosis Date   Allergy    Dizziness and giddiness 12/24/2012   GERD (gastroesophageal reflux disease)    Headache(784.0) 12/24/2012   Heart murmur    Obesity    Reactive airways dysfunction syndrome ([facility])    Vitamin D deficiency    Past Surgical History:  Procedure Laterality Date   ENDOMETRIAL ABLATION     FOOT SURGERY Left 2006   TONSILLECTOMY AND ADENOIDECTOMY     Allergies  Allergen Reactions   Other Swelling    Makes eyes swell up   Iodinated Diagnostic Agents Swelling    Makes eyes swell up   Montelukast Other (See Comments)    dizzy   Cefzil [Cefprozil] Other (See Comments)    Stomach pain   Singulair [Montelukast Sodium] Other (See Comments)    dizzy   No current facility-administered medications on file prior to encounter.   Current Outpatient Medications on File Prior to Encounter  Medication Sig Dispense Refill   Acetaminophen (TYLENOL PO) Take by mouth. As needed     Aspirin-Acetaminophen-Caffeine (GOODY HEADACHE PO) Take 1 each by mouth as needed.     Calcium Carbonate-Vitamin D (CALTRATE 600+D PO) Take by mouth daily. 600 D-3     carboxymethylcellulose (REFRESH PLUS) 0.5 % SOLN 1 drop 3 (three) times daily as needed.     dicyclomine (BENTYL) 20 MG tablet Take 1 tablet (20 mg total) by mouth 2 (two) times daily. 20 tablet 0   famotidine (PEPCID) 40 MG tablet Take 1 tablet (40 mg total) by mouth daily. 30 tablet 1   Fish Oil OIL Take by mouth.     Multiple Vitamin (MULTIVITAMIN) tablet  Take 1 tablet by mouth daily.     ondansetron (ZOFRAN) 4 MG tablet Take 1 tablet (4 mg total) by mouth every 6 (six) hours. 12 tablet 0   polyethylene glycol (MIRALAX) 17 g packet Take 17 g by mouth 2 (two) times daily. 14 each 0   Probiotic Product (PROBIOTIC PEARLS PO) Take by mouth daily.      Triamcinolone Acetonide (NASACORT ALLERGY 24HR NA) Place into the nose daily.     vitamin C (ASCORBIC ACID) 500 MG tablet Take 500 mg by mouth daily.     Social History   Socioeconomic History   Marital status: Married    Spouse name: Not on file   Number of children: 2   Years of education: 14   Highest education level: Not on file  Occupational History    Employer: [employer]  Tobacco Use   Smoking status: Never   Smokeless tobacco: Never  Vaping Use   Vaping Use: Never used  Substance and Sexual Activity   Alcohol use: Yes    Comment: she drinks about once or twice a year.   Drug use: No   Sexual activity: Yes    Partners: Male    Comment: Married  Other Topics Concern   Not on file  Social History Narrative   Lives  at home   Right-handed   Caffeine: drinks a glass of unsweet tea each day   Social Determinants of Health   Financial Resource Strain: Not on file  Food Insecurity: Not on file  Transportation Needs: Not on file  Physical Activity: Not on file  Stress: Not on file  Social Connections: Not on file  Intimate Partner Violence: Not on file   Family History  Problem Relation Age of Onset   COPD Mother    Diabetes Mother    Diabetes Father    Hypertension Father    Coronary artery disease Father    COPD Father    Arthritis Father     OBJECTIVE:  Vitals:   01/17/21 1042  BP: 134/87  Pulse: 80  Resp: 16  Temp: 98.6 F (37 C)  TempSrc: Oral  SpO2: 96%     General appearance: alert; no distress Skin: small area of erythema to LT breast in 7 o'clock position with punctate lesion, mildly TTP, no obvious drainage or bleeding, some scab  formation.   Psychological: alert and cooperative; normal mood and affect   ASSESSMENT & PLAN:  1. Insect bite of left breast, initial encounter     Meds ordered this encounter  Medications   doxycycline (VIBRAMYCIN) 100 MG capsule    Sig: Take 1 capsule (100 mg total) by mouth 2 (two) times daily.    Dispense:  20 capsule    Refill:  0    Order Specific Question:   Supervising Provider    Answer:   Raylene Everts [[id number]]   mupirocin cream (BACTROBAN) 2 %    Sig: Apply 1 application topically 2 (two) times daily.    Dispense:  15 g    Refill:  0    Order Specific Question:   Supervising Provider    Answer:   Raylene Everts [[id number]]    Wash site daily with warm water and mild soap Keep covered to avoid friction Take antibiotic and cream use as prescribed and to completion Follow up here or with PCP if symptoms persists Return or go to the ED if you have any new or worsening symptoms increased redness, swelling, pain, nausea, vomiting, fever, chills, etc...   Reviewed expectations re: course of current medical issues. Questions answered. Outlined signs and symptoms indicating need for more acute intervention. Patient verbalized understanding. After Visit Summary given.           Lestine Box, PA-C 01/17/21 1059

## 2021-01-17 NOTE — Discharge Instructions (Addendum)
Wash site daily with warm water and mild soap Keep covered to avoid friction Take antibiotic and cream use as prescribed and to completion Follow up here or with PCP if symptoms persists Return or go to the ED if you have any new or worsening symptoms increased redness, swelling, pain, nausea, vomiting, fever, chills, etc..Marland Kitchen

## 2021-02-23 ENCOUNTER — Other Ambulatory Visit: Payer: Self-pay | Admitting: Family Medicine

## 2021-02-23 DIAGNOSIS — K219 Gastro-esophageal reflux disease without esophagitis: Secondary | ICD-10-CM

## 2021-02-25 NOTE — Telephone Encounter (Signed)
Please contact patient to have her set up appt. Thank you 

## 2021-02-25 NOTE — Telephone Encounter (Signed)
Sent mychart message

## 2021-02-26 NOTE — Telephone Encounter (Signed)
Sent mychart message

## 2021-03-07 ENCOUNTER — Other Ambulatory Visit: Payer: Self-pay

## 2021-03-07 ENCOUNTER — Encounter (INDEPENDENT_AMBULATORY_CARE_PROVIDER_SITE_OTHER): Payer: Self-pay

## 2021-03-07 ENCOUNTER — Telehealth (INDEPENDENT_AMBULATORY_CARE_PROVIDER_SITE_OTHER): Payer: Self-pay

## 2021-03-07 ENCOUNTER — Encounter (INDEPENDENT_AMBULATORY_CARE_PROVIDER_SITE_OTHER): Payer: Self-pay | Admitting: Gastroenterology

## 2021-03-07 ENCOUNTER — Other Ambulatory Visit (INDEPENDENT_AMBULATORY_CARE_PROVIDER_SITE_OTHER): Payer: Self-pay

## 2021-03-07 ENCOUNTER — Ambulatory Visit (INDEPENDENT_AMBULATORY_CARE_PROVIDER_SITE_OTHER): Payer: 59 | Admitting: Gastroenterology

## 2021-03-07 VITALS — BP 143/88 | HR 72 | Temp 98.3°F | Ht 59.0 in | Wt 158.7 lb

## 2021-03-07 DIAGNOSIS — K219 Gastro-esophageal reflux disease without esophagitis: Secondary | ICD-10-CM | POA: Diagnosis not present

## 2021-03-07 DIAGNOSIS — K5900 Constipation, unspecified: Secondary | ICD-10-CM | POA: Insufficient documentation

## 2021-03-07 DIAGNOSIS — K529 Noninfective gastroenteritis and colitis, unspecified: Secondary | ICD-10-CM | POA: Insufficient documentation

## 2021-03-07 DIAGNOSIS — K5904 Chronic idiopathic constipation: Secondary | ICD-10-CM | POA: Diagnosis not present

## 2021-03-07 MED ORDER — CLENPIQ 10-3.5-12 MG-GM -GM/160ML PO SOLN: 1.0000 | Freq: Once | ORAL | 0 refills | Status: AC

## 2021-03-07 NOTE — Patient Instructions (Addendum)
Start Benefiber fiber supplements daily Schedule colonoscopy

## 2021-03-07 NOTE — Progress Notes (Signed)
Maylon Peppers, M.D. Gastroenterology & Hepatology [facility] [address] [place], [place] [address] Primary Care Physician: Erven Colla, DO [place] [address]  Referring MD: PCP  Chief Complaint: Episode of nausea and epigastric pain, chronic constipation  History of Present Illness: Ana Gross is a 50 y.o. female with past medical history of GERD, obesity, who presents for evaluation of episode of nausea and epigastric pain, and chronic constipation.  Patient reports that around April 2022 she had acute onset of nausea and epigastric pain described as stabbing sensation. She reports that she did not feel any better and decided to got to an urgent care. She also noticed some "abdominal gurgling" at that time.  She was given a prescription for Bentyl, famotidine, Zofran and Miralax in the urgent care.  Notably, this event lasted 5 days only.  She reports that after this her symptoms have never recurred.  She does not recall having similar symptoms in the past.  She was initially told this could be irritable bowel syndrome.She is no longer taking Bentyl or Zofran. Has not been taking Miralax as well.  Currently she has a BM 3 times a week, but sometimes when she goes she can have up to 4 BM normal formed Bms.  She reports that's her usual bowel movement frequency. Sometimes feels she gets bloated when she has not had a bowel movement.  She takes famotidine 2-3 times a month, as she only has heartburn after eating something spicy.  The patient denies having any recent nausea, vomiting, fever, chills, hematochezia, melena, hematemesis, abdominal distention, abdominal pain, diarrhea, jaundice, pruritus or weight loss.  Most recent labs performed on 08/29/2020 showed a CMP with alkaline phosphatase which was borderline of 124 but otherwise normal, normal CRP, CBC, ESR.  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 Last Colonoscopy:possibly when she was very young but she cannot recall exactly when  FHx: neg for any gastrointestinal/liver disease, grand uncle and aunt had stomach cancer Social: neg smoking, alcohol or illicit drug use Surgical: no abdominal surgeries  Past Medical History: Past Medical History:  Diagnosis Date   Allergy    Dizziness and giddiness 12/24/2012   GERD (gastroesophageal reflux disease)    Headache(784.0) 12/24/2012   Heart murmur    Obesity    Reactive airways dysfunction syndrome ([place])    Vitamin D deficiency     Past Surgical History: Past Surgical History:  Procedure Laterality Date   ENDOMETRIAL ABLATION     FOOT SURGERY Left 2006   TONSILLECTOMY AND ADENOIDECTOMY      Family History: Family History  Problem Relation Age of Onset   COPD Mother    Diabetes Mother    Diabetes Father    Hypertension Father    Coronary artery disease Father    COPD Father    Arthritis Father     Social History: Social History   Tobacco Use  Smoking Status Never  Smokeless Tobacco Never   Social History   Substance and Sexual Activity  Alcohol Use Yes   Comment: she drinks about once or twice a year.   Social History   Substance and Sexual Activity  Drug Use No    Allergies: Allergies  Allergen Reactions   Other Swelling    Makes eyes swell up   Iodinated Diagnostic Agents Swelling    Makes eyes swell up   Montelukast Other (See Comments)    dizzy  Cefzil [Cefprozil] Other (See Comments)    Stomach pain   Singulair [Montelukast Sodium] Other (See Comments)    dizzy    Medications: Current Outpatient Medications  Medication Sig Dispense Refill   Acetaminophen (TYLENOL PO) Take by mouth. As needed     Aspirin-Acetaminophen-Caffeine (GOODY HEADACHE PO) Take 1 each by mouth as needed.     Calcium Carbonate-Vitamin D (CALTRATE 600+D PO) Take by mouth daily. 600 D-3     carboxymethylcellulose (REFRESH PLUS) 0.5 % SOLN 1 drop 3  (three) times daily as needed.     dicyclomine (BENTYL) 20 MG tablet Take 1 tablet (20 mg total) by mouth 2 (two) times daily. 20 tablet 0   doxycycline (VIBRAMYCIN) 100 MG capsule Take 1 capsule (100 mg total) by mouth 2 (two) times daily. 20 capsule 0   famotidine (PEPCID) 40 MG tablet Take 1 tablet (40 mg total) by mouth daily. 30 tablet 1   Fish Oil OIL Take by mouth.     Multiple Vitamin (MULTIVITAMIN) tablet Take 1 tablet by mouth daily.     mupirocin cream (BACTROBAN) 2 % Apply 1 application topically 2 (two) times daily. 15 g 0   ondansetron (ZOFRAN) 4 MG tablet Take 1 tablet (4 mg total) by mouth every 6 (six) hours. 12 tablet 0   polyethylene glycol (MIRALAX) 17 g packet Take 17 g by mouth 2 (two) times daily. 14 each 0   Probiotic Product (PROBIOTIC PEARLS PO) Take by mouth daily.      Triamcinolone Acetonide (NASACORT ALLERGY 24HR NA) Place into the nose daily.     vitamin C (ASCORBIC ACID) 500 MG tablet Take 500 mg by mouth daily.     No current facility-administered medications for this visit.    Review of Systems: GENERAL: negative for malaise, night sweats HEENT: No changes in hearing or vision, no nose bleeds or other nasal problems. NECK: Negative for lumps, goiter, pain and significant neck swelling RESPIRATORY: Negative for cough, wheezing CARDIOVASCULAR: Negative for chest pain, leg swelling, palpitations, orthopnea GI: SEE HPI MUSCULOSKELETAL: Negative for joint pain or swelling, back pain, and muscle pain. SKIN: Negative for lesions, rash PSYCH: Negative for sleep disturbance, mood disorder and recent psychosocial stressors. HEMATOLOGY Negative for prolonged bleeding, bruising easily, and swollen nodes. ENDOCRINE: Negative for cold or heat intolerance, polyuria, polydipsia and goiter. NEURO: negative for tremor, gait imbalance, syncope and seizures. The remainder of the review of systems is noncontributory.   Physical Exam: BP (!) 143/88 (BP Location: Left Arm,  Patient Position: Sitting, Cuff Size: Small)   Pulse 72   Temp 98.3 F (36.8 C) (Oral)   Ht 4' 11" (1.499 m)   Wt 158 lb 11.2 oz (72 kg)   BMI 32.05 kg/m  GENERAL: The patient is AO x3, in no acute distress. HEENT: Head is normocephalic and atraumatic. EOMI are intact. Mouth is well hydrated and without lesions. NECK: Supple. No masses LUNGS: Clear to auscultation. No presence of rhonchi/wheezing/rales. Adequate chest expansion HEART: RRR, normal s1 and s2. ABDOMEN: Soft, nontender, no guarding, no peritoneal signs, and nondistended. BS +. No masses. EXTREMITIES: Without any cyanosis, clubbing, rash, lesions or edema. NEUROLOGIC: AOx3, no focal motor deficit. SKIN: no jaundice, no rashes   Imaging/Labs: as above  I personally reviewed and interpreted the available labs, imaging and endoscopic files.  Impression and Plan: Ana Gross is a 50 y.o. female with past medical history of GERD, obesity, who presents for evaluation of episode of nausea and epigastric pain, and chronic  constipation.  In terms of her acute episode of nausea and epigastric pain, this was self-limited and she did not present any similar symptoms in the past.  It is very likely this was related to an episode of acute gastroenteritis that has resolved as she has been asymptomatic while off medication.  The patient was reassured about her current medical condition but if her symptoms recur we can try symptomatic management.  She has presented chronic constipation for multiple years.  Currently she is not taking any medication to have more regular bowel movements.  Due to this, I advised her to start taking Benefiber.  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  Finally, she is due for colorectal cancer screening, I will schedule her for a colonoscopy.  More than 25% of the office visit was dedicated to discussing the procedure, including the day  of and risks involved. Patient understands what the procedure involves including the benefits and any risks. Patient understands alternatives to the proposed procedure. Risks including (but not limited to) bleeding, tearing of the lining (perforation), rupture of adjacent organs, problems with heart and lung function, infection, and medication reactions. A small percentage of complications may require surgery, hospitalization, repeat endoscopic procedure, and/or transfusion. A small percentage of polyps and other tumors may not be seen.   -Start Benefiber fiber supplements daily -Schedule colonoscopy  All questions were answered.      Maylon Peppers, MD Gastroenterology and Hepatology [facility]

## 2021-03-07 NOTE — Telephone Encounter (Signed)
LeighAnn Jaxston Chohan, CMA  

## 2021-03-07 NOTE — H&P (View-Only) (Signed)
Billye Nydam Castaneda, M.D. Gastroenterology & Hepatology [facility] [address] [place], [place] [address] Primary Care Physician: Taylor, Malena M, DO [address] [place] [address]  Referring MD: PCP  Chief Complaint: Episode of nausea and epigastric pain, chronic constipation  History of Present Illness: Ana Gross is a 50 y.o. female with past medical history of GERD, obesity, who presents for evaluation of episode of nausea and epigastric pain, and chronic constipation.  Patient reports that around April 2022 she had acute onset of nausea and epigastric pain described as stabbing sensation. She reports that she did not feel any better and decided to got to an urgent care. She also noticed some "abdominal gurgling" at that time.  She was given a prescription for Bentyl, famotidine, Zofran and Miralax in the urgent care.  Notably, this event lasted 5 days only.  She reports that after this her symptoms have never recurred.  She does not recall having similar symptoms in the past.  She was initially told this could be irritable bowel syndrome.She is no longer taking Bentyl or Zofran. Has not been taking Miralax as well.  Currently she has a BM 3 times a week, but sometimes when she goes she can have up to 4 BM normal formed Bms.  She reports that's her usual bowel movement frequency. Sometimes feels she gets bloated when she has not had a bowel movement.  She takes famotidine 2-3 times a month, as she only has heartburn after eating something spicy.  The patient denies having any recent nausea, vomiting, fever, chills, hematochezia, melena, hematemesis, abdominal distention, abdominal pain, diarrhea, jaundice, pruritus or weight loss.  Most recent labs performed on 08/29/2020 showed a CMP with alkaline phosphatase which was borderline of 124 but otherwise normal, normal CRP, CBC, ESR.  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 Last Colonoscopy:possibly when she was very young but she cannot recall exactly when  FHx: neg for any gastrointestinal/liver disease, grand uncle and aunt had stomach cancer Social: neg smoking, alcohol or illicit drug use Surgical: no abdominal surgeries  Past Medical History: Past Medical History:  Diagnosis Date   Allergy    Dizziness and giddiness 12/24/2012   GERD (gastroesophageal reflux disease)    Headache(784.0) 12/24/2012   Heart murmur    Obesity    Reactive airways dysfunction syndrome ([facility])    Vitamin D deficiency     Past Surgical History: Past Surgical History:  Procedure Laterality Date   ENDOMETRIAL ABLATION     FOOT SURGERY Left 2006   TONSILLECTOMY AND ADENOIDECTOMY      Family History: Family History  Problem Relation Age of Onset   COPD Mother    Diabetes Mother    Diabetes Father    Hypertension Father    Coronary artery disease Father    COPD Father    Arthritis Father     Social History: Social History   Tobacco Use  Smoking Status Never  Smokeless Tobacco Never   Social History   Substance and Sexual Activity  Alcohol Use Yes   Comment: she drinks about once or twice a year.   Social History   Substance and Sexual Activity  Drug Use No    Allergies: Allergies  Allergen Reactions   Other Swelling    Makes eyes swell up   Iodinated Diagnostic Agents Swelling    Makes eyes swell up   Montelukast Other (See Comments)    dizzy     Cefzil [Cefprozil] Other (See Comments)    Stomach pain   Singulair [Montelukast Sodium] Other (See Comments)    dizzy    Medications: Current Outpatient Medications  Medication Sig Dispense Refill   Acetaminophen (TYLENOL PO) Take by mouth. As needed     Aspirin-Acetaminophen-Caffeine (GOODY HEADACHE PO) Take 1 each by mouth as needed.     Calcium Carbonate-Vitamin D (CALTRATE 600+D PO) Take by mouth daily. 600 D-3     carboxymethylcellulose (REFRESH PLUS) 0.5 % SOLN 1 drop 3  (three) times daily as needed.     dicyclomine (BENTYL) 20 MG tablet Take 1 tablet (20 mg total) by mouth 2 (two) times daily. 20 tablet 0   doxycycline (VIBRAMYCIN) 100 MG capsule Take 1 capsule (100 mg total) by mouth 2 (two) times daily. 20 capsule 0   famotidine (PEPCID) 40 MG tablet Take 1 tablet (40 mg total) by mouth daily. 30 tablet 1   Fish Oil OIL Take by mouth.     Multiple Vitamin (MULTIVITAMIN) tablet Take 1 tablet by mouth daily.     mupirocin cream (BACTROBAN) 2 % Apply 1 application topically 2 (two) times daily. 15 g 0   ondansetron (ZOFRAN) 4 MG tablet Take 1 tablet (4 mg total) by mouth every 6 (six) hours. 12 tablet 0   polyethylene glycol (MIRALAX) 17 g packet Take 17 g by mouth 2 (two) times daily. 14 each 0   Probiotic Product (PROBIOTIC PEARLS PO) Take by mouth daily.      Triamcinolone Acetonide (NASACORT ALLERGY 24HR NA) Place into the nose daily.     vitamin C (ASCORBIC ACID) 500 MG tablet Take 500 mg by mouth daily.     No current facility-administered medications for this visit.    Review of Systems: GENERAL: negative for malaise, night sweats HEENT: No changes in hearing or vision, no nose bleeds or other nasal problems. NECK: Negative for lumps, goiter, pain and significant neck swelling RESPIRATORY: Negative for cough, wheezing CARDIOVASCULAR: Negative for chest pain, leg swelling, palpitations, orthopnea GI: SEE HPI MUSCULOSKELETAL: Negative for joint pain or swelling, back pain, and muscle pain. SKIN: Negative for lesions, rash PSYCH: Negative for sleep disturbance, mood disorder and recent psychosocial stressors. HEMATOLOGY Negative for prolonged bleeding, bruising easily, and swollen nodes. ENDOCRINE: Negative for cold or heat intolerance, polyuria, polydipsia and goiter. NEURO: negative for tremor, gait imbalance, syncope and seizures. The remainder of the review of systems is noncontributory.   Physical Exam: BP (!) 143/88 (BP Location: Left Arm,  Patient Position: Sitting, Cuff Size: Small)   Pulse 72   Temp 98.3 F (36.8 C) (Oral)   Ht 4' 11" (1.499 m)   Wt 158 lb 11.2 oz (72 kg)   BMI 32.05 kg/m  GENERAL: The patient is AO x3, in no acute distress. HEENT: Head is normocephalic and atraumatic. EOMI are intact. Mouth is well hydrated and without lesions. NECK: Supple. No masses LUNGS: Clear to auscultation. No presence of rhonchi/wheezing/rales. Adequate chest expansion HEART: RRR, normal s1 and s2. ABDOMEN: Soft, nontender, no guarding, no peritoneal signs, and nondistended. BS +. No masses. EXTREMITIES: Without any cyanosis, clubbing, rash, lesions or edema. NEUROLOGIC: AOx3, no focal motor deficit. SKIN: no jaundice, no rashes   Imaging/Labs: as above  I personally reviewed and interpreted the available labs, imaging and endoscopic files.  Impression and Plan: Ana Gross is a 50 y.o. female with past medical history of GERD, obesity, who presents for evaluation of episode of nausea and epigastric pain, and chronic  constipation.  In terms of her acute episode of nausea and epigastric pain, this was self-limited and she did not present any similar symptoms in the past.  It is very likely this was related to an episode of acute gastroenteritis that has resolved as she has been asymptomatic while off medication.  The patient was reassured about her current medical condition but if her symptoms recur we can try symptomatic management.  She has presented chronic constipation for multiple years.  Currently she is not taking any medication to have more regular bowel movements.  Due to this, I advised her to start taking Benefiber.  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  Finally, she is due for colorectal cancer screening, I will schedule her for a colonoscopy.  More than 25% of the office visit was dedicated to discussing the procedure, including the day  of and risks involved. Patient understands what the procedure involves including the benefits and any risks. Patient understands alternatives to the proposed procedure. Risks including (but not limited to) bleeding, tearing of the lining (perforation), rupture of adjacent organs, problems with heart and lung function, infection, and medication reactions. A small percentage of complications may require surgery, hospitalization, repeat endoscopic procedure, and/or transfusion. A small percentage of polyps and other tumors may not be seen.   -Start Benefiber fiber supplements daily -Schedule colonoscopy  All questions were answered.      Kagan Mutchler Castaneda, MD Gastroenterology and Hepatology [facility]  

## 2021-03-13 ENCOUNTER — Encounter (HOSPITAL_COMMUNITY)
Admission: RE | Disposition: A | Discharge status: Home / Self Care | Payer: Self-pay | Source: Home / Self Care | Attending: Gastroenterology

## 2021-03-13 ENCOUNTER — Encounter (HOSPITAL_COMMUNITY): Payer: Self-pay | Admitting: Gastroenterology

## 2021-03-13 ENCOUNTER — Ambulatory Visit (HOSPITAL_COMMUNITY): Payer: 59 | Admitting: Anesthesiology

## 2021-03-13 ENCOUNTER — Ambulatory Visit (HOSPITAL_COMMUNITY)
Admission: RE | Admit: 2021-03-13 | Discharge: 2021-03-13 | Disposition: A | Discharge status: Home / Self Care | Payer: 59 | Attending: Gastroenterology | Admitting: Gastroenterology

## 2021-03-13 ENCOUNTER — Other Ambulatory Visit: Payer: Self-pay

## 2021-03-13 DIAGNOSIS — E669 Obesity, unspecified: Secondary | ICD-10-CM | POA: Insufficient documentation

## 2021-03-13 DIAGNOSIS — Q438 Other specified congenital malformations of intestine: Secondary | ICD-10-CM | POA: Diagnosis not present

## 2021-03-13 DIAGNOSIS — Z1211 Encounter for screening for malignant neoplasm of colon: Secondary | ICD-10-CM | POA: Diagnosis not present

## 2021-03-13 DIAGNOSIS — K219 Gastro-esophageal reflux disease without esophagitis: Secondary | ICD-10-CM | POA: Diagnosis not present

## 2021-03-13 DIAGNOSIS — Z91041 Radiographic dye allergy status: Secondary | ICD-10-CM | POA: Insufficient documentation

## 2021-03-13 DIAGNOSIS — K5909 Other constipation: Secondary | ICD-10-CM | POA: Insufficient documentation

## 2021-03-13 DIAGNOSIS — Z79899 Other long term (current) drug therapy: Secondary | ICD-10-CM | POA: Insufficient documentation

## 2021-03-13 DIAGNOSIS — Z6832 Body mass index (BMI) 32.0-32.9, adult: Secondary | ICD-10-CM | POA: Insufficient documentation

## 2021-03-13 DIAGNOSIS — Z881 Allergy status to other antibiotic agents status: Secondary | ICD-10-CM | POA: Insufficient documentation

## 2021-03-13 DIAGNOSIS — Z888 Allergy status to other drugs, medicaments and biological substances status: Secondary | ICD-10-CM | POA: Diagnosis not present

## 2021-03-13 HISTORY — PX: COLONOSCOPY WITH PROPOFOL: SHX5780

## 2021-03-13 SURGERY — COLONOSCOPY WITH PROPOFOL
Anesthesia: General

## 2021-03-13 MED ORDER — PROPOFOL 10 MG/ML IV BOLUS
INTRAVENOUS | Status: DC | PRN
  Administered 2021-03-13: 100 mg via INTRAVENOUS
  Administered 2021-03-13 (×2): 50 mg via INTRAVENOUS

## 2021-03-13 MED ORDER — STERILE WATER FOR IRRIGATION IR SOLN
Status: DC | PRN
  Administered 2021-03-13: 100 mL

## 2021-03-13 MED ORDER — PROPOFOL 500 MG/50ML IV EMUL
INTRAVENOUS | Status: DC | PRN
  Administered 2021-03-13: 125 ug/kg/min via INTRAVENOUS

## 2021-03-13 MED ORDER — LACTATED RINGERS IV SOLN: INTRAVENOUS | Status: DC

## 2021-03-13 MED ORDER — LIDOCAINE HCL (CARDIAC) PF 50 MG/5ML IV SOSY
PREFILLED_SYRINGE | INTRAVENOUS | Status: DC | PRN
  Administered 2021-03-13: 50 mg via INTRAVENOUS

## 2021-03-13 NOTE — Anesthesia Preprocedure Evaluation (Addendum)
Anesthesia Evaluation  Patient identified by MRN, date of birth, ID band Patient awake    Reviewed: Allergy & Precautions, NPO status , Patient's Chart, lab work & pertinent test results  History of Anesthesia Complications Negative for: history of anesthetic complications  Airway Mallampati: II  TM Distance: >3 FB Neck ROM: Full    Dental  (+) Dental Advisory Given Crown :   Pulmonary neg pulmonary ROS,    Pulmonary exam normal breath sounds clear to auscultation       Cardiovascular Exercise Tolerance: Good Normal cardiovascular exam+ Valvular Problems/Murmurs  Rhythm:Regular Rate:Normal     Neuro/Psych  Headaches,    GI/Hepatic Neg liver ROS, GERD  Medicated and Controlled,  Endo/Other  negative endocrine ROS  Renal/GU negative Renal ROS     Musculoskeletal negative musculoskeletal ROS (+)   Abdominal   Peds  Hematology negative hematology ROS (+)   Anesthesia Other Findings   Reproductive/Obstetrics negative OB ROS                            Anesthesia Physical Anesthesia Plan  ASA: 2  Anesthesia Plan: General   Post-op Pain Management:    Induction: Intravenous  PONV Risk Score and Plan: Propofol infusion  Airway Management Planned: Nasal Cannula and Natural Airway  Additional Equipment:   Intra-op Plan:   Post-operative Plan:   Informed Consent: I have reviewed the patients History and Physical, chart, labs and discussed the procedure including the risks, benefits and alternatives for the proposed anesthesia with the patient or authorized representative who has indicated his/her understanding and acceptance.     Dental advisory given  Plan Discussed with: CRNA and Surgeon  Anesthesia Plan Comments:         Anesthesia Quick Evaluation

## 2021-03-13 NOTE — Discharge Instructions (Signed)
You are being discharged to home.  Resume your previous diet.  Your physician has recommended a repeat colonoscopy in 10 years for screening purposes.  

## 2021-03-13 NOTE — Anesthesia Procedure Notes (Signed)
Date/Time: 03/13/2021 12:33 PM Performed by: Vista Deck, CRNA Pre-anesthesia Checklist: Patient identified, Emergency Drugs available, Suction available, Timeout performed and Patient being monitored Patient Re-evaluated:Patient Re-evaluated prior to induction Oxygen Delivery Method: Nasal Cannula

## 2021-03-13 NOTE — Anesthesia Postprocedure Evaluation (Signed)
Anesthesia Post Note  Patient: Ana Gross  Procedure(s) Performed: COLONOSCOPY WITH PROPOFOL  Patient location during evaluation: Endoscopy Anesthesia Type: General Level of consciousness: awake and alert and oriented Pain management: pain level controlled Vital Signs Assessment: post-procedure vital signs reviewed and stable Respiratory status: spontaneous breathing and respiratory function stable Cardiovascular status: blood pressure returned to baseline and stable Postop Assessment: no apparent nausea or vomiting Anesthetic complications: no   No notable events documented.   Last Vitals:  Vitals:   03/13/21 1112 03/13/21 1305  BP: (!) 131/98 112/65  Pulse: 92 83  Resp: 15 18  Temp: 36.9 C 36.6 C  SpO2: 95% 99%    Last Pain:  Vitals:   03/13/21 1305  TempSrc: Oral  PainSc: 0-No pain                 Dmiya Malphrus C Motty Borin

## 2021-03-13 NOTE — Transfer of Care (Signed)
Immediate Anesthesia Transfer of Care Note  Patient: Ana Gross  Procedure(s) Performed: COLONOSCOPY WITH PROPOFOL  Patient Location: Endoscopy Unit  Anesthesia Type:General  Level of Consciousness: awake and patient cooperative  Airway & Oxygen Therapy: Patient Spontanous Breathing  Post-op Assessment: Report given to RN and Post -op Vital signs reviewed and stable  Post vital signs: Reviewed and stable  Last Vitals:  Vitals Value Taken Time  BP    Temp    Pulse    Resp    SpO2     See vital sign flow sheet Last Pain:  Vitals:   03/13/21 1234  TempSrc:   PainSc: 4       Patients Stated Pain Goal: 5 ([id number])  Complications: No notable events documented.

## 2021-03-13 NOTE — Op Note (Addendum)
[facility] Patient Name: Ana Gross Procedure Date: 03/13/2021 12:19 PM MRN: [id number] Date of Birth: March 17, 1971 Attending MD: Maylon Peppers ,  CSN: [id number] Age: 50 Admit Type: Outpatient Procedure:                Colonoscopy Indications:              Screening for colorectal malignant neoplasm Providers:                Maylon Peppers, Caprice Kluver, Aram Candela Referring MD:              Medicines:                Monitored Anesthesia Care Complications:            No immediate complications. Estimated Blood Loss:     Estimated blood loss: none. Procedure:                Pre-Anesthesia Assessment:                           - Prior to the procedure, a History and Physical                            was performed, and patient medications, allergies                            and sensitivities were reviewed. The patient's                            tolerance of previous anesthesia was reviewed.                           - The risks and benefits of the procedure and the                            sedation options and risks were discussed with the                            patient. All questions were answered and informed                            consent was obtained.                           - ASA Grade Assessment: II - A patient with mild                            systemic disease.                           After obtaining informed consent, the colonoscope                            was passed under direct vision. Throughout the                            procedure, the patient's blood pressure, pulse,  and                            oxygen saturations were monitored continuously. The                            [id number] scope was introduced through                            the anus and advanced to the the cecum, identified                            by appendiceal orifice and ileocecal valve. The                            colonoscopy was performed  without difficulty. The                            patient tolerated the procedure well. The quality                            of the bowel preparation was excellent. Scope In: 12:38:03 PM Scope Out: 12:59:10 PM Scope Withdrawal Time: 0 hours 16 minutes 5 seconds  Total Procedure Duration: 0 hours 21 minutes 7 seconds  Findings:      The perianal and digital rectal examinations were normal.      The sigmoid colon and descending colon were moderately tortuous.      The retroflexed view of the distal rectum and anal verge was normal and       showed no anal or rectal abnormalities. Impression:               - Tortuous colon.                           - The distal rectum and anal verge are normal on                            retroflexion view.                           - No specimens collected. Moderate Sedation:      Per Anesthesia Care Recommendation:           - Discharge patient to home (ambulatory).                           - Resume previous diet.                           - Repeat colonoscopy in 10 years for screening                            purposes. Procedure Code(s):        --- Professional ---                           [id number], Colorectal cancer screening; colonoscopy on  individual not meeting criteria for high risk Diagnosis Code(s):        --- Professional ---                           Z12.11, Encounter for screening for malignant                            neoplasm of colon                           Q43.8, Other specified congenital malformations of                            intestine CPT copyright 2019 American Medical Association. All rights reserved. The codes documented in this report are preliminary and upon coder review may  be revised to meet current compliance requirements. Maylon Peppers, MD Maylon Peppers,  03/13/2021 1:09:29 PM This report has been signed electronically. Number of Addenda: 0

## 2021-03-13 NOTE — Telephone Encounter (Signed)
Talk to patient this morning please cancel prescription. Patient states not needing it any more

## 2021-03-13 NOTE — Interval H&P Note (Signed)
History and Physical Interval Note:  03/13/2021 11:16 AM Ana Gross is a 50 y.o. female with past medical history of GERD, obesity, coming for screening colonoscopy. The patient denies having any complaints such as melena, hematochezia, abdominal pain or distention, change in her bowel movement consistency or frequency, no changes in her weight recently.  No family history of colorectal cancer. . BP (!) 131/98   Pulse 92   Temp 98.4 F (36.9 C) (Oral)   Resp 15   Ht '4\' 11"'$  (1.499 m)   Wt 72 kg   SpO2 95%   BMI 32.05 kg/m  GENERAL: The patient is AO x3, in no acute distress. HEENT: Head is normocephalic and atraumatic. EOMI are intact. Mouth is well hydrated and without lesions. NECK: Supple. No masses LUNGS: Clear to auscultation. No presence of rhonchi/wheezing/rales. Adequate chest expansion HEART: RRR, normal s1 and s2. ABDOMEN: Soft, nontender, no guarding, no peritoneal signs, and nondistended. BS +. No masses. EXTREMITIES: Without any cyanosis, clubbing, rash, lesions or edema. NEUROLOGIC: AOx3, no focal motor deficit. SKIN: no jaundice, no rashes    Ana Gross  has presented today for surgery, with the diagnosis of Screening Colonoscopy.  The various methods of treatment have been discussed with the patient and family. After consideration of risks, benefits and other options for treatment, the patient has consented to  Procedure(s) with comments: COLONOSCOPY WITH PROPOFOL (N/A) - 12:30 as a surgical intervention.  The patient's history has been reviewed, patient examined, no change in status, stable for surgery.  I have reviewed the patient's chart and labs.  Questions were answered to the patient's satisfaction.     Ana Gross

## 2021-03-21 ENCOUNTER — Encounter (HOSPITAL_COMMUNITY): Payer: Self-pay | Admitting: Gastroenterology

## 2021-05-07 ENCOUNTER — Other Ambulatory Visit: Payer: Self-pay

## 2021-05-07 ENCOUNTER — Ambulatory Visit
Admission: EM | Admit: 2021-05-07 | Discharge: 2021-05-07 | Disposition: A | Discharge status: Home / Self Care | Payer: 59 | Attending: Family Medicine | Admitting: Family Medicine

## 2021-05-07 DIAGNOSIS — J029 Acute pharyngitis, unspecified: Secondary | ICD-10-CM

## 2021-05-07 LAB — POCT RAPID STREP A (OFFICE): Rapid Strep A Screen: NEGATIVE

## 2021-05-07 NOTE — ED Triage Notes (Signed)
Patient presents to Urgent Care with complaints of sore throat since yesterday. She states she was tested negative for COVID today. Hx of seasonal allergies. Treating symptoms with advil.   Denies fever.

## 2021-05-07 NOTE — Discharge Instructions (Signed)
You may use over the counter ibuprofen or acetaminophen as needed.  For a sore throat, over the counter products such as [employer] Peroxyl Mouth Sore Rinse or Chloraseptic Sore Throat Spray may provide some temporary relief. Your rapid strep test was negative today. We have sent your throat swab for culture and will let you know of any positive results. 

## 2021-05-07 NOTE — ED Provider Notes (Signed)
[facility]   [id number] 05/07/21 Arrival Time: 0932  ASSESSMENT & PLAN:  1. Sore throat    No signs of peritonsillar abscess. Discussed. Likely viral; discussed.  Results for orders placed or performed during the hospital encounter of 05/07/21  POCT rapid strep A  Result Value Ref Range   Rapid Strep A Screen Negative Negative   Labs Reviewed  CULTURE, GROUP A STREP [facility])  POCT RAPID STREP A (OFFICE)      Discharge Instructions      You may use over the counter ibuprofen or acetaminophen as needed.  For a sore throat, over the counter products such as [employer] Peroxyl Mouth Sore Rinse or Chloraseptic Sore Throat Spray may provide some temporary relief. Your rapid strep test was negative today. We have sent your throat swab for culture and will let you know of any positive results.    Reviewed expectations re: course of current medical issues. Questions answered. Outlined signs and symptoms indicating need for more acute intervention. Patient verbalized understanding. After Visit Summary given.   SUBJECTIVE:  Ana Gross is a 50 y.o. female who reports a sore throat. Describes as sharp pain with swallowing. Onset abrupt beginning yesterday. Symptoms have progressed to a point and plateaued since beginning; without voice changes. No respiratory symptoms. Normal PO intake but reports discomfort with swallowing. No specific alleviating factors. Fever: absent. No neck pain or swelling. No associated nausea, vomiting, or abdominal pain. Known sick contacts: none. Recent travel: none. OTC treatment: Advil; mild help.  OBJECTIVE:  Vitals:   05/07/21 1006  BP: (!) 146/89  Pulse: 79  Resp: 16  Temp: 97.6 F (36.4 C)  TempSrc: Temporal  SpO2: 95%    General appearance: alert; no distress HEENT: throat with mild erythema and cobblestoning; uvula is midline Neck: supple with FROM; no lymphadenopathy Lungs: speaks full sentences without difficulty;  unlabored Abd: soft; non-tender Skin: reveals no rash; warm and dry Psychological: alert and cooperative; normal mood and affect  Allergies  Allergen Reactions   Iodinated Diagnostic Agents Swelling    Makes eyes swell up   Cefzil [Cefprozil] Other (See Comments)    Stomach pain   Singulair [Montelukast Sodium] Other (See Comments)    dizzy    Past Medical History:  Diagnosis Date   Allergy    Dizziness and giddiness 12/24/2012   GERD (gastroesophageal reflux disease)    Headache(784.0) 12/24/2012   Heart murmur    Obesity    Reactive airways dysfunction syndrome (HCC)    Vitamin D deficiency    Social History   Socioeconomic History   Marital status: Married    Spouse name: Not on file   Number of children: 2   Years of education: 14   Highest education level: Not on file  Occupational History    Employer: [employer]  Tobacco Use   Smoking status: Never   Smokeless tobacco: Never  Vaping Use   Vaping Use: Never used  Substance and Sexual Activity   Alcohol use: Yes    Comment: she drinks about once or twice a year.   Drug use: No   Sexual activity: Yes    Partners: Male    Comment: Married  Other Topics Concern   Not on file  Social History Narrative   Lives at home   Right-handed   Caffeine: drinks a glass of unsweet tea each day   Social Determinants of Health   Financial Resource Strain: Not on file  Food Insecurity: Not on  file  Transportation Needs: Not on file  Physical Activity: Not on file  Stress: Not on file  Social Connections: Not on file  Intimate Partner Violence: Not on file   Family History  Problem Relation Age of Onset   COPD Mother    Diabetes Mother    Diabetes Father    Hypertension Father    Coronary artery disease Father    COPD Father    Arthritis Father            Vanessa Kick, MD 05/07/21 1227

## 2021-05-11 LAB — CULTURE, GROUP A STREP (THRC)

## 2021-05-27 ENCOUNTER — Other Ambulatory Visit: Payer: Self-pay

## 2021-05-27 ENCOUNTER — Encounter: Payer: Self-pay | Admitting: Podiatry

## 2021-05-27 ENCOUNTER — Ambulatory Visit (INDEPENDENT_AMBULATORY_CARE_PROVIDER_SITE_OTHER): Payer: 59

## 2021-05-27 ENCOUNTER — Ambulatory Visit: Payer: 59 | Admitting: Podiatry

## 2021-05-27 DIAGNOSIS — M722 Plantar fascial fibromatosis: Secondary | ICD-10-CM | POA: Diagnosis not present

## 2021-05-27 MED ORDER — TRIAMCINOLONE ACETONIDE 10 MG/ML IJ SUSP
10.0000 mg | Freq: Once | INTRAMUSCULAR | Status: AC
  Administered 2021-05-27: 10 mg

## 2021-05-27 NOTE — Progress Notes (Signed)
Subjective:   Patient ID: Ana Gross, female   DOB: 50 y.o.   MRN: [id number]   HPI Patient presents stating that her left heel has started to flareup over the last few weeks and she is been working long shifts   ROS      Objective:  Physical Exam  Neurovascular status intact with exquisite discomfort plantar aspect left heel insertional point tendon calcaneus inflammation fluid medial band     Assessment:  Acute Planter fasciitis left inflammation fluid medial band     Plan:  H&P sterile prep injected the fascia 3 mg Kenalog 5 mg Xylocaine and instructed on supportive shoes orthotics anti-inflammatories and reappoint as symptoms indicate

## 2021-06-17 ENCOUNTER — Ambulatory Visit: Payer: 59 | Admitting: Podiatry

## 2021-10-05 IMAGING — CR DG FOOT COMPLETE 3+V*R*
3 series · 3 of 3 positions shown · non-contrast
Comparison: None.

CLINICAL DATA: Injury 2 days ago with pain of the third through
fifth toes.

EXAM:
RIGHT FOOT COMPLETE - 3+ VIEW

[t foot ap right]
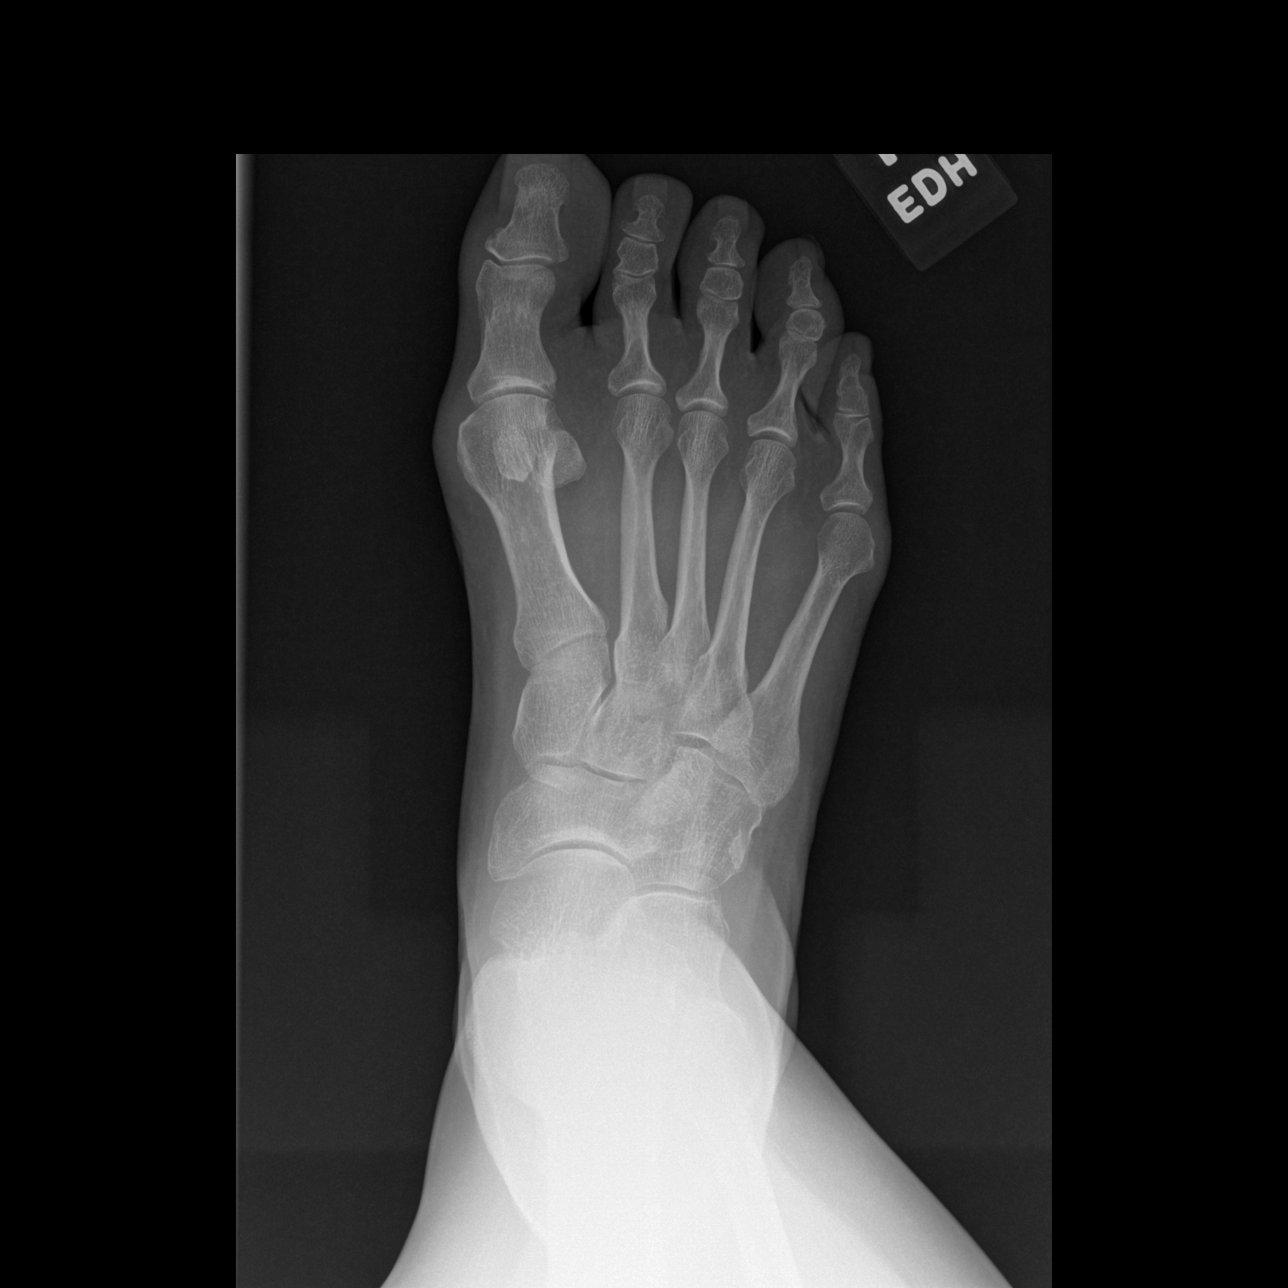

[t foot oblique right]
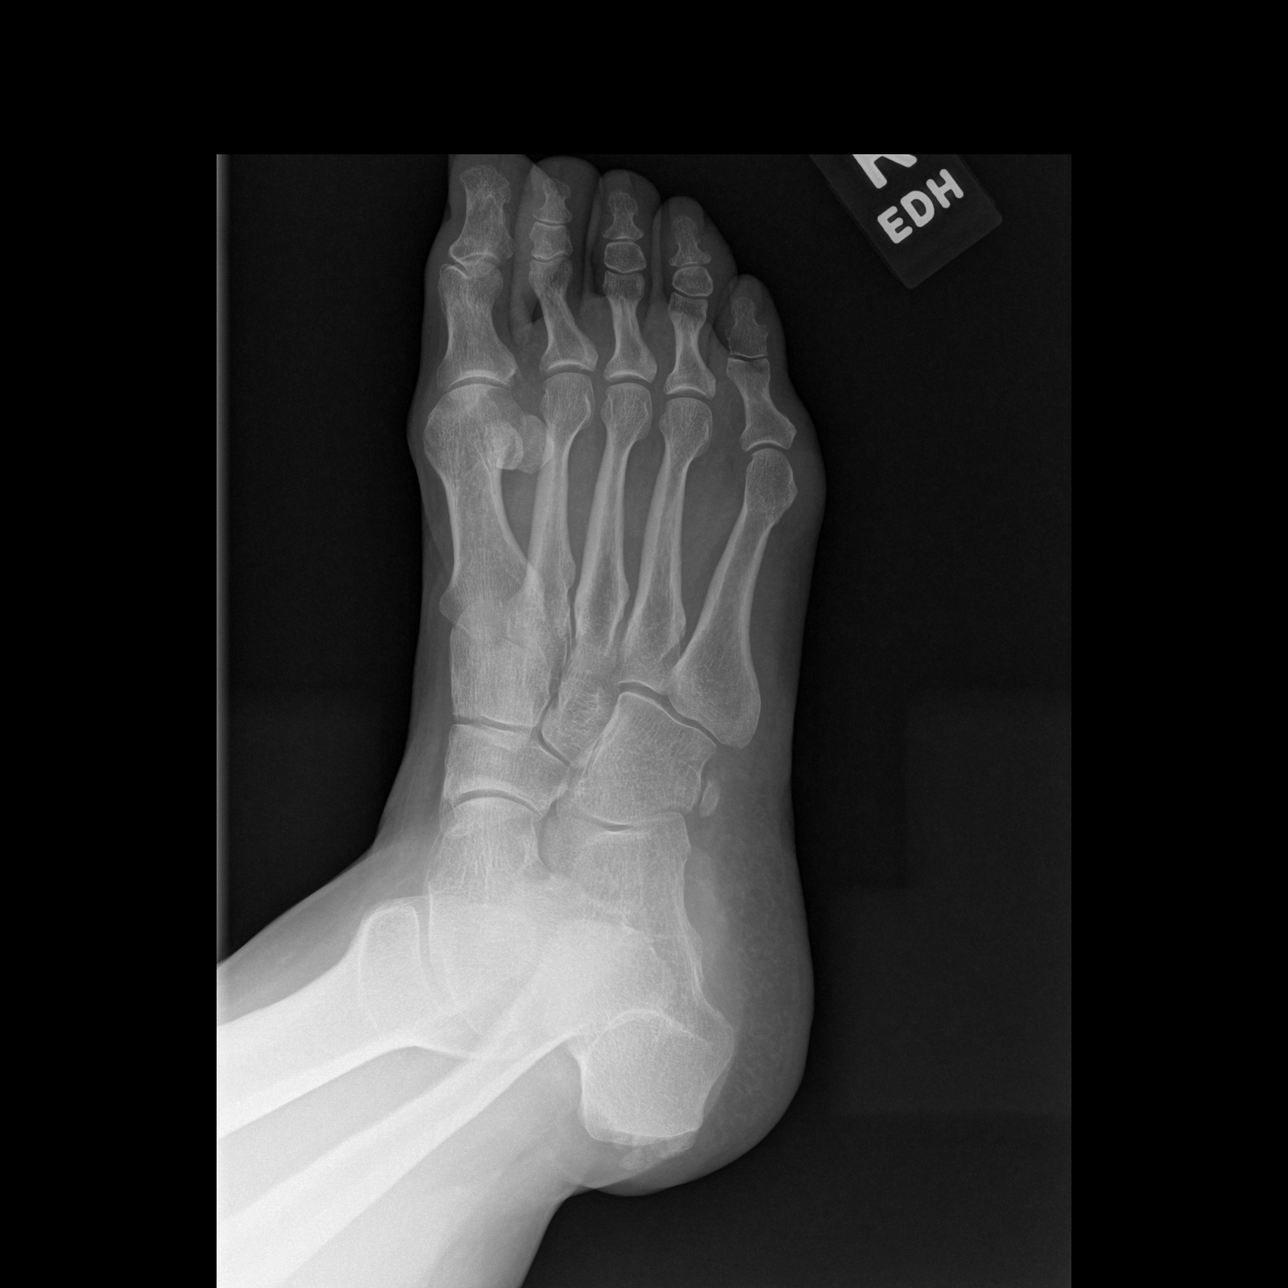

[t foot lat right]
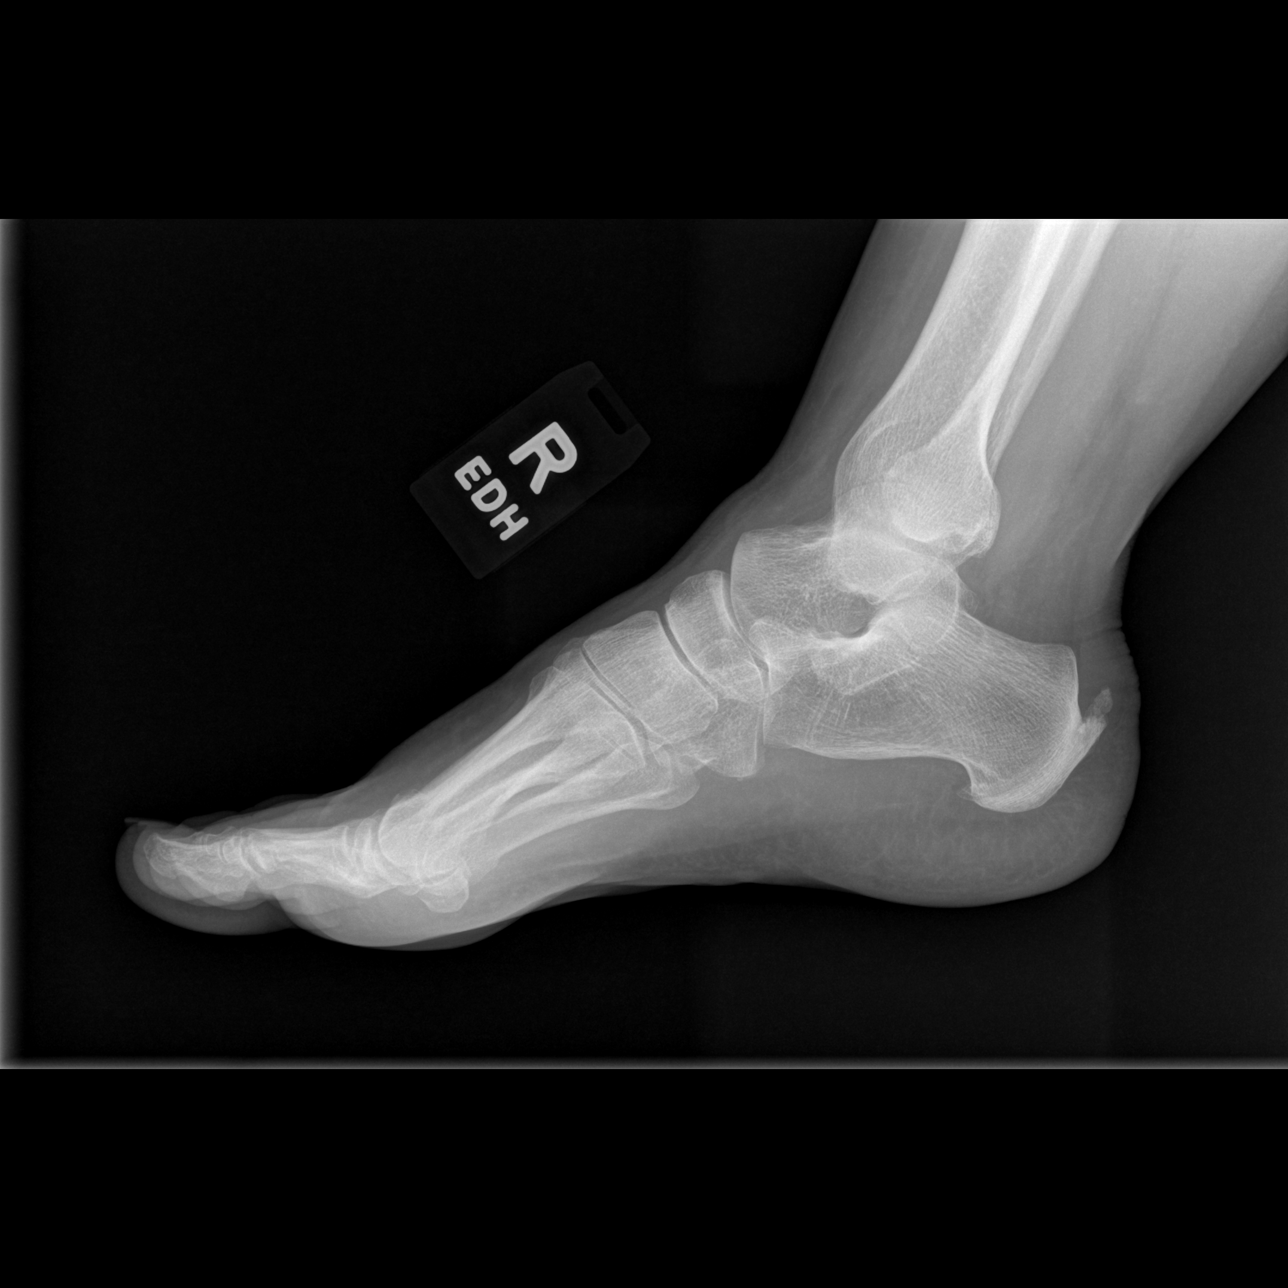

[3 of 3 positions shown; findings below may reference images not displayed]

FINDINGS: Mild nonspecific soft tissue swelling of the forefoot. No evidence
of fracture or dislocation. No evidence of pre-existing arthritis.
Calcaneal spurring incidentally noted.
IMPRESSION: Mild nonspecific soft tissue swelling of the forefoot. No acute bone
or joint finding.

## 2022-01-31 ENCOUNTER — Ambulatory Visit
Admission: RE | Admit: 2022-01-31 | Discharge: 2022-01-31 | Disposition: A | Discharge status: Home / Self Care | Payer: 59 | Source: Ambulatory Visit | Attending: Family Medicine | Admitting: Family Medicine

## 2022-01-31 VITALS — BP 127/84 | HR 76 | Temp 99.4°F | Resp 18

## 2022-01-31 DIAGNOSIS — T148XXA Other injury of unspecified body region, initial encounter: Secondary | ICD-10-CM | POA: Diagnosis not present

## 2022-01-31 DIAGNOSIS — M79604 Pain in right leg: Secondary | ICD-10-CM | POA: Diagnosis not present

## 2022-01-31 MED ORDER — DOXYCYCLINE HYCLATE 100 MG PO CAPS: 100.0000 mg | ORAL_CAPSULE | Freq: Two times a day (BID) | ORAL | 0 refills | Status: DC

## 2022-01-31 NOTE — ED Provider Notes (Signed)
[facility]    CSN: [id number] Arrival date & time: 01/31/22  1642      History   Chief Complaint Chief Complaint  Patient presents with   Leg Swelling    Right leg & foot swelling. I had a blister on my heel & an old cat bite not sure if either are the cause of the swelling. - Entered by patient    HPI Ana Gross is a 51 y.o. female.   Presenting today with 1 day history of right foot and lower leg swelling surrounding a blister to the posterior heel.  Some redness to the area, tenderness but no drainage, bleeding.  Denies numbness, tingling, weakness, decreased range of motion.  Putting Neosporin to the area with minimal relief.    Past Medical History:  Diagnosis Date   Allergy    Dizziness and giddiness 12/24/2012   GERD (gastroesophageal reflux disease)    Headache(784.0) 12/24/2012   Heart murmur    Obesity    Reactive airways dysfunction syndrome ([place])    Vitamin D deficiency     Patient Active Problem List   Diagnosis Date Noted   Constipation 03/07/2021   Acute gastroenteritis 03/07/2021   Acquired short Achilles tendon of right lower extremity 06/22/2019   Tendonitis, Achilles, right 06/22/2019   Calcaneal spur of right foot 05/05/2019   Pain in right foot 05/05/2019   Swelling of finger of both hands 01/07/2018   Headache(784.0) 12/24/2012   GERD (gastroesophageal reflux disease) 08/12/2011   Heart murmur     Past Surgical History:  Procedure Laterality Date   COLONOSCOPY WITH PROPOFOL N/A 03/13/2021   Procedure: COLONOSCOPY WITH PROPOFOL;  Surgeon: Harvel Quale, MD;  Location: [facility];  Service: Gastroenterology;  Laterality: N/A;  12:30   ENDOMETRIAL ABLATION     FOOT SURGERY Left 2006   TONSILLECTOMY AND ADENOIDECTOMY      OB History   No obstetric history on file.      Home Medications    Prior to Admission medications   Medication Sig Start Date End Date Taking? Authorizing Provider  doxycycline  (VIBRAMYCIN) 100 MG capsule Take 1 capsule (100 mg total) by mouth 2 (two) times daily. 01/31/22  Yes Volney American, PA-C  acetaminophen (TYLENOL) 500 MG tablet Take 1,000 mg by mouth every 8 (eight) hours as needed for moderate pain.    [provider]  Aspirin-Acetaminophen-Caffeine (GOODY HEADACHE PO) Take 1 Package by mouth daily as needed (pain).    [provider]  Calcium Carb-Cholecalciferol (CALTRATE 600+D3 SOFT) 600-20 MG-MCG CHEW Caltrate 600 plus D  600 mg-20 mcg (800 unit) chewable tablet    [provider]  carboxymethylcellulose (REFRESH PLUS) 0.5 % SOLN Place 1 drop into both eyes 3 (three) times daily as needed (dry eyes).    [provider]  CLENPIQ 10-3.5-12 MG-GM -GM/160ML SOLN See admin instructions. 03/07/21   [provider]  famotidine (PEPCID) 40 MG tablet Take 1 tablet (40 mg total) by mouth daily. Patient taking differently: Take 40 mg by mouth daily as needed for heartburn. 11/12/20   Lovena Le, Malena M, DO  fexofenadine (ALLEGRA) 180 MG tablet Take 180 mg by mouth daily.    [provider]  Menthol, Topical Analgesic, (BIOFREEZE) 10 % LIQD Apply 1 application topically daily as needed (pain).    [provider]  Multiple Vitamin (MULTIVITAMIN) tablet Take 1 tablet by mouth daily.    [provider]  triamcinolone (NASACORT) 55 MCG/ACT AERO nasal  inhaler Place 1 spray into the nose daily.    [provider]    Family History Family History  Problem Relation Age of Onset   COPD Mother    Diabetes Mother    Diabetes Father    Hypertension Father    Coronary artery disease Father    COPD Father    Arthritis Father     Social History Social History   Tobacco Use   Smoking status: Never   Smokeless tobacco: Never  Vaping Use   Vaping Use: Never used  Substance Use Topics   Alcohol use: Yes    Comment: she drinks about once or twice a year.   Drug use: No     Allergies    Iodinated contrast media, Cefzil [cefprozil], and Singulair [montelukast sodium]   Review of Systems Review of Systems Per HPI  Physical Exam Triage Vital Signs ED Triage Vitals  Enc Vitals Group     BP 01/31/22 1648 127/84     Pulse Rate 01/31/22 1648 76     Resp 01/31/22 1648 18     Temp 01/31/22 1648 99.4 F (37.4 C)     Temp Source 01/31/22 1648 Oral     SpO2 01/31/22 1648 94 %     Weight --      Height --      Head Circumference --      Peak Flow --      Pain Score 01/31/22 1650 7     Pain Loc --      Pain Edu? --      Excl. in [place]? --    No data found.  Updated Vital Signs BP 127/84 (BP Location: Right Arm)   Pulse 76   Temp 99.4 F (37.4 C) (Oral)   Resp 18   SpO2 94%   Visual Acuity Right Eye Distance:   Left Eye Distance:   Bilateral Distance:    Right Eye Near:   Left Eye Near:    Bilateral Near:     Physical Exam Vitals and nursing note reviewed.  Constitutional:      Appearance: Normal appearance. She is not ill-appearing.  HENT:     Head: Atraumatic.  Eyes:     Extraocular Movements: Extraocular movements intact.     Conjunctiva/sclera: Conjunctivae normal.  Cardiovascular:     Rate and Rhythm: Normal rate and regular rhythm.     Heart sounds: Normal heart sounds.  Pulmonary:     Effort: Pulmonary effort is normal.     Breath sounds: Normal breath sounds.  Musculoskeletal:        General: Swelling and tenderness present. Normal range of motion.     Cervical back: Normal range of motion and neck supple.     Comments: Trace edema right lower leg, tender to palpation in region of blister  Skin:    General: Skin is warm.     Comments: Erythematous, edematous blister to the right posterior heel.  Tender to palpation in this region and to the lower ankle  Neurological:     Mental Status: She is alert and oriented to person, place, and time.     Comments: Right lower extremity neurovascularly intact  Psychiatric:        Mood and Affect:  Mood normal.        Thought Content: Thought content normal.        Judgment: Judgment normal.    UC Treatments / Results  Labs (all labs ordered are listed,  but only abnormal results are displayed) Labs Reviewed - No data to display  EKG   Radiology No results found.  Procedures Procedures (including critical care time)  Medications Ordered in UC Medications - No data to display  Initial Impression / Assessment and Plan / UC Course  I have reviewed the triage vital signs and the nursing notes.  Pertinent labs & imaging results that were available during my care of the patient were reviewed by me and considered in my medical decision making (see chart for details).     Possibly early cellulitis from a blister, treat with doxycycline, good wound care, compression, elevation.  Return for worsening symptoms.  Final Clinical Impressions(s) / UC Diagnoses   Final diagnoses:  Blister  Right leg pain   Discharge Instructions   None    ED Prescriptions     Medication Sig Dispense Auth. Provider   doxycycline (VIBRAMYCIN) 100 MG capsule Take 1 capsule (100 mg total) by mouth 2 (two) times daily. 14 capsule Volney American, [place]      PDMP not reviewed this encounter.   Volney American, [place] 01/31/22 1722

## 2022-01-31 NOTE — ED Triage Notes (Signed)
Right foot and lower leg swelling since last night.  Has blister to back of foot since Monday.  States cat bit her on June 24 behind right knee.

## 2022-02-14 ENCOUNTER — Ambulatory Visit (HOSPITAL_COMMUNITY)
Admission: RE | Admit: 2022-02-14 | Discharge: 2022-02-14 | Disposition: A | Discharge status: Home / Self Care | Payer: 59 | Source: Ambulatory Visit | Attending: Family Medicine | Admitting: Family Medicine

## 2022-02-14 ENCOUNTER — Ambulatory Visit: Payer: 59 | Admitting: Family Medicine

## 2022-02-14 VITALS — BP 124/83 | HR 62 | Temp 98.4°F | Ht 59.0 in | Wt 141.2 lb

## 2022-02-14 DIAGNOSIS — Z13220 Encounter for screening for lipoid disorders: Secondary | ICD-10-CM

## 2022-02-14 DIAGNOSIS — Z130 Encounter for screening for diseases of the blood and blood-forming organs and certain disorders involving the immune mechanism: Secondary | ICD-10-CM | POA: Diagnosis not present

## 2022-02-14 DIAGNOSIS — E663 Overweight: Secondary | ICD-10-CM

## 2022-02-14 DIAGNOSIS — R600 Localized edema: Secondary | ICD-10-CM

## 2022-02-14 NOTE — Patient Instructions (Signed)
Labs today.  We will call with [place] results.  Follow up annually.  Take care  Dr. Lacinda Axon

## 2022-02-15 LAB — CMP14+EGFR
ALT: 12 IU/L (ref 0–32)
AST: 14 IU/L (ref 0–40)
Albumin/Globulin Ratio: 2.3 — ABNORMAL HIGH (ref 1.2–2.2)
Albumin: 4.9 g/dL (ref 3.8–4.9)
Alkaline Phosphatase: 97 IU/L (ref 44–121)
BUN/Creatinine Ratio: 20 (ref 9–23)
BUN: 14 mg/dL (ref 6–24)
Bilirubin Total: 0.4 mg/dL (ref 0.0–1.2)
CO2: 24 mmol/L (ref 20–29)
Calcium: 9.7 mg/dL (ref 8.7–10.2)
Chloride: 103 mmol/L (ref 96–106)
Creatinine, Ser: 0.7 mg/dL (ref 0.57–1.00)
Globulin, Total: 2.1 g/dL (ref 1.5–4.5)
Glucose: 92 mg/dL (ref 70–99)
Potassium: 4.2 mmol/L (ref 3.5–5.2)
Sodium: 142 mmol/L (ref 134–144)
Total Protein: 7 g/dL (ref 6.0–8.5)
eGFR: 105 mL/min/{1.73_m2} (ref >59)

## 2022-02-15 LAB — LIPID PANEL
Chol/HDL Ratio: 3.1 ratio (ref 0.0–4.4)
Cholesterol, Total: 182 mg/dL (ref 100–199)
HDL: 58 mg/dL (ref >39)
LDL Chol Calc (NIH): 108 mg/dL — ABNORMAL HIGH (ref 0–99)
Triglycerides: 89 mg/dL (ref 0–149)
VLDL Cholesterol Cal: 16 mg/dL (ref 5–40)

## 2022-02-15 LAB — CBC
Hematocrit: 40.9 % (ref 34.0–46.6)
Hemoglobin: 13.5 g/dL (ref 11.1–15.9)
MCH: 30 pg (ref 26.6–33.0)
MCHC: 33 g/dL (ref 31.5–35.7)
MCV: 91 fL (ref 79–97)
Platelets: 268 10*3/uL (ref 150–450)
RBC: 4.5 x10E6/uL (ref 3.77–5.28)
RDW: 12.7 % (ref 11.7–15.4)
WBC: 4.4 10*3/uL (ref 3.4–10.8)

## 2022-02-16 NOTE — Progress Notes (Unsigned)
Subjective:  Patient ID: Ana Gross, female    DOB: 25-Aug-1970  Age: 51 y.o. MRN: [id number]  CC: Chief Complaint  Patient presents with   New Patient (Initial Visit)    Establish care.  Patient says right leg is swelling.    HPI:  51 year old female with GERD presents to establish care.   Patient reports swelling of her right lower extremity. Has been going on for the past few weeks. Has had fairly recent travel. No SOB. No known exacerbating or relieving factors.   Needs labs today.   Preventative health care up to date excluding shingles vaccine.  Patient Active Problem List   Diagnosis Date Noted   Overweight (BMI 25.0-29.9) 02/17/2022   Edema of right lower extremity 02/17/2022   GERD (gastroesophageal reflux disease) 08/12/2011    Social Hx   Social History   Socioeconomic History   Marital status: Married    Spouse name: Not on file   Number of children: 2   Years of education: 14   Highest education level: Not on file  Occupational History    Employer: [employer]  Tobacco Use   Smoking status: Never   Smokeless tobacco: Never  Vaping Use   Vaping Use: Never used  Substance and Sexual Activity   Alcohol use: Yes    Comment: she drinks about once or twice a year.   Drug use: No   Sexual activity: Yes    Partners: Male    Comment: Married  Other Topics Concern   Not on file  Social History Narrative   Lives at home   Right-handed   Caffeine: drinks a glass of unsweet tea each day   Social Determinants of Health   Financial Resource Strain: Not on file  Food Insecurity: Not on file  Transportation Needs: Not on file  Physical Activity: Not on file  Stress: Not on file  Social Connections: Not on file    Review of Systems Per HPI  Objective:  BP 124/83   Pulse 62   Temp 98.4 F (36.9 C) (Oral)   Ht 4' 11"  (1.499 m)   Wt 141 lb 3.2 oz (64 kg)   SpO2 99%   BMI 28.52 kg/m      02/14/2022    9:22 AM 01/31/2022    4:48 PM  05/07/2021   10:06 AM  BP/Weight  Systolic BP 542 706 237  Diastolic BP 83 84 89  Wt. (Lbs) 141.2    BMI 28.52 kg/m2      Physical Exam Vitals and nursing note reviewed.  Constitutional:      General: She is not in acute distress.    Appearance: Normal appearance.  HENT:     Head: Normocephalic and atraumatic.  Eyes:     General:        Right eye: No discharge.        Left eye: No discharge.     Conjunctiva/sclera: Conjunctivae normal.  Cardiovascular:     Rate and Rhythm: Normal rate and regular rhythm.  Pulmonary:     Effort: Pulmonary effort is normal.     Breath sounds: Normal breath sounds. No wheezing, rhonchi or rales.  Abdominal:     General: There is no distension.     Palpations: Abdomen is soft.     Tenderness: There is no abdominal tenderness.  Musculoskeletal:     Comments: Mild swelling of the right lower extremity.  Neurological:     Mental Status: She is  alert.  Psychiatric:        Mood and Affect: Mood normal.        Behavior: Behavior normal.     Lab Results  Component Value Date   WBC 4.4 02/14/2022   HGB 13.5 02/14/2022   HCT 40.9 02/14/2022   PLT 268 02/14/2022   GLUCOSE 92 02/14/2022   CHOL 182 02/14/2022   TRIG 89 02/14/2022   HDL 58 02/14/2022   LDLCALC 108 (H) 02/14/2022   ALT 12 02/14/2022   AST 14 02/14/2022   NA 142 02/14/2022   K 4.2 02/14/2022   CL 103 02/14/2022   CREATININE 0.70 02/14/2022   BUN 14 02/14/2022   CO2 24 02/14/2022   TSH 1.200 08/19/2014     Assessment & Plan:   Problem List Items Addressed This Visit       Other   Edema of right lower extremity - Primary    [place] negative for DVT. Supportive care.      Relevant Orders   US Venous Img Lower Unilateral Right (Completed)   Overweight (BMI 25.0-29.9)   Relevant Orders   CMP14+EGFR (Completed)   Other Visit Diagnoses     Screening for lipid disorders       Relevant Orders   Lipid panel (Completed)   Screening for deficiency anemia        Relevant Orders   CBC (Completed)      Follow-up:  Annually  Thersa Salt DO [place]

## 2022-02-17 DIAGNOSIS — R600 Localized edema: Secondary | ICD-10-CM | POA: Insufficient documentation

## 2022-02-17 DIAGNOSIS — E663 Overweight: Secondary | ICD-10-CM | POA: Insufficient documentation

## 2022-02-17 NOTE — Assessment & Plan Note (Signed)
[place] negative for DVT. Supportive care.

## 2022-04-16 ENCOUNTER — Telehealth: Payer: Self-pay

## 2022-04-16 NOTE — Telephone Encounter (Signed)
Error

## 2022-05-19 ENCOUNTER — Ambulatory Visit (INDEPENDENT_AMBULATORY_CARE_PROVIDER_SITE_OTHER): Payer: 59

## 2022-05-19 ENCOUNTER — Encounter: Payer: Self-pay | Admitting: Podiatry

## 2022-05-19 ENCOUNTER — Ambulatory Visit: Payer: 59 | Admitting: Podiatry

## 2022-05-19 DIAGNOSIS — M21619 Bunion of unspecified foot: Secondary | ICD-10-CM

## 2022-05-19 DIAGNOSIS — M722 Plantar fascial fibromatosis: Secondary | ICD-10-CM

## 2022-05-19 DIAGNOSIS — M21621 Bunionette of right foot: Secondary | ICD-10-CM | POA: Diagnosis not present

## 2022-05-19 MED ORDER — TRIAMCINOLONE ACETONIDE 10 MG/ML IJ SUSP
10.0000 mg | Freq: Once | INTRAMUSCULAR | Status: AC
  Administered 2022-05-19: 10 mg

## 2022-05-19 NOTE — Progress Notes (Signed)
Subjective:   Patient ID: Ana Gross, female   DOB: 51 y.o.   MRN: [id number]   HPI Patient presents with pain around the bunion site right and slightly proximal to this stating that its been hurting her for around a month.  Has had long-term problems with the bunion and bone on the outside and we had fixed the left foot number years ago.  Patient tries wider shoes tries other modalities without relief of symptoms   ROS      Objective:  Physical Exam  Neurovascular status intact muscle strength found to be adequate with inflammation pain around the first MPJ right and around the fifth MPJ redness around the joint surfaces with inflammation in the distal aspect of the plantar fascia near its insertion into the head of the first metatarsal     Assessment:  Plantar fasciitis right distal along with structural bunion and tailor's bunion with history of correction of the left     Plan:  H&P reviewed x-rays I did a distal injection of the fascia today 3 mg Kenalog 5 mg Xylocaine and applied fascial brace to lift up the arch discussed bunion and tailor's bunion correction which will be done at 1 point in the future.  X-rays indicate elevation of the 1 2 intermetatarsal angle of approximately 15 degrees and on the lateral side shows elevation of the 4 5 intermetatarsal angle

## 2022-06-02 ENCOUNTER — Ambulatory Visit: Payer: 59 | Admitting: Podiatry

## 2022-06-09 ENCOUNTER — Encounter: Payer: Self-pay | Admitting: Podiatry

## 2022-06-09 ENCOUNTER — Ambulatory Visit: Payer: 59 | Admitting: Podiatry

## 2022-06-09 DIAGNOSIS — M21619 Bunion of unspecified foot: Secondary | ICD-10-CM | POA: Diagnosis not present

## 2022-06-09 DIAGNOSIS — M21621 Bunionette of right foot: Secondary | ICD-10-CM

## 2022-06-09 DIAGNOSIS — M722 Plantar fascial fibromatosis: Secondary | ICD-10-CM | POA: Diagnosis not present

## 2022-06-10 NOTE — Progress Notes (Signed)
Subjective:   Patient ID: Ana Gross, female   DOB: 51 y.o.   MRN: [id number]   HPI Patient states overall her heel is feeling some better but she did have a weekend where she was quite active and developed problems and admits she has not been using night splint.  Also has significant bunion deformity right tailor's bunion deformity right with history of correction of the left   ROS      Objective:  Physical Exam  Neuro vascular status intact with patient's heel open mildly tender it improved with patient walking with a better heel toe gait with significant structural deformity right with bunion deformity tailor's bunion deformity noted right     Assessment:  Fascial inflammation left improving but slightly tender with patient having night splint at home along with bunion tailor's bunion deformity right     Plan:  H&P reviewed both conditions and for the left we will continue with night splint and ice therapy along with support shoes may require other treatment in future.  Discussed bunion tailor's bunion she wants to get it done she needs to check her schedule and hopefully can get it scheduled and then will see me prior to reviewed the surgical procedure is necessary with all questions answered today

## 2022-06-11 ENCOUNTER — Telehealth: Payer: Self-pay | Admitting: Podiatry

## 2022-06-11 NOTE — Telephone Encounter (Addendum)
DOS: 07/08/2022  [employer] Effective 07/21/2021  [place] Bunionectomy Rt [phone]) Metatarsal Osteotomy 5th Rt ([id number])  Deductible: $900 with $769.19 remaining Out-of-Pocket: $3,300 with $3,016.27 remaining CoInsurance: 0%  Prior authorization is required per [facility] website.  Authorization #: [id number] Authorization Valid: 07/08/2022 - 10/06/2022

## 2022-06-18 ENCOUNTER — Encounter: Payer: Self-pay | Admitting: Podiatry

## 2022-06-18 ENCOUNTER — Ambulatory Visit: Payer: 59 | Admitting: Podiatry

## 2022-06-18 DIAGNOSIS — M21619 Bunion of unspecified foot: Secondary | ICD-10-CM

## 2022-06-18 DIAGNOSIS — M722 Plantar fascial fibromatosis: Secondary | ICD-10-CM

## 2022-06-18 DIAGNOSIS — M21621 Bunionette of right foot: Secondary | ICD-10-CM | POA: Diagnosis not present

## 2022-06-18 NOTE — Progress Notes (Signed)
Subjective:   Patient ID: Ana Gross, female   DOB: 51 y.o.   MRN: [id number]   HPI Patient states the heel left is still tender but improving and ready to get the right foot fixed surgically like she had the left one done   ROS      Objective:  Physical Exam  Neurovascular status intact structural bunion deformity right tailor's bunion deformity right red and painful when pressed with patient having tried shoe gear modifications padding soaks and anti-inflammatories in the past with history of correction of the left foot that did well.  Patient's left plantar fascia is doing reasonably well mildly discomforting     Assessment:  Structural deformity right with bone deformity chronic along with Planter fasciitis left improved but present     Plan:  H&P reviewed conditions and x-rays and at this point recommended surgical intervention right.  I allowed her to read consent form going over distal osteotomy right first and fifth metatarsal with fixation and we reviewed alternative treatments complications and patient signed consent form after extensive review.  Understands total recovery can take approximately 6 months to 1 year and all questions answered today.  Air fracture walker dispensed fitted properly to her lower leg with all instructions on usage and I want her to get used to it prior to surgery and will use it during the postoperative period and also we may use this on the left 1 to help with the plantar fascial inflammation  X-rays indicate elevation of the 1 2 intermetatarsal angle of approximately 15 degrees elevation of the 4 5 intermetatarsal angle noted right

## 2022-07-07 ENCOUNTER — Other Ambulatory Visit: Payer: 59

## 2022-07-07 MED ORDER — OXYCODONE-ACETAMINOPHEN 10-325 MG PO TABS: 1.0000 | ORAL_TABLET | ORAL | 0 refills | Status: DC | PRN

## 2022-07-07 MED ORDER — ONDANSETRON HCL 4 MG PO TABS: 4.0000 mg | ORAL_TABLET | Freq: Three times a day (TID) | ORAL | 0 refills | Status: DC | PRN

## 2022-07-07 NOTE — Addendum Note (Signed)
Addended by: Wallene Huh on: 07/07/2022 05:08 PM   Modules accepted: Orders

## 2022-07-08 ENCOUNTER — Encounter: Payer: Self-pay | Admitting: Podiatry

## 2022-07-08 DIAGNOSIS — M2011 Hallux valgus (acquired), right foot: Secondary | ICD-10-CM | POA: Diagnosis not present

## 2022-07-09 ENCOUNTER — Encounter: Payer: 59 | Admitting: Podiatry

## 2022-07-16 ENCOUNTER — Ambulatory Visit (INDEPENDENT_AMBULATORY_CARE_PROVIDER_SITE_OTHER): Payer: 59

## 2022-07-16 ENCOUNTER — Ambulatory Visit (INDEPENDENT_AMBULATORY_CARE_PROVIDER_SITE_OTHER): Payer: 59 | Admitting: Podiatry

## 2022-07-16 DIAGNOSIS — Z09 Encounter for follow-up examination after completed treatment for conditions other than malignant neoplasm: Secondary | ICD-10-CM | POA: Diagnosis not present

## 2022-07-16 NOTE — Progress Notes (Signed)
Patient seen at the office for POV #1 DOS 07/08/2022 [place] Bunionectomy RT, Metatarsal Osteotomy 5th RT. Patient denies any fever, nausea, vomiting, chills, chest pain. Pain is minimal at this time. Xrays and incision site was assessed by Dr. Paulla Dolly. Patient is able to slowly transition into a surgical shoe but still use the cam boot for long walks. Foot redressed with a coban wrap and surgical shoe was dispensed today.   All questions were answered by Dr. Paulla Dolly.   Patient was advised to contact office with any concerns or questions.

## 2022-07-18 ENCOUNTER — Other Ambulatory Visit: Payer: 59

## 2022-07-24 ENCOUNTER — Other Ambulatory Visit: Payer: 59

## 2022-07-30 ENCOUNTER — Other Ambulatory Visit: Payer: 59

## 2022-08-06 ENCOUNTER — Ambulatory Visit (INDEPENDENT_AMBULATORY_CARE_PROVIDER_SITE_OTHER): Payer: 59

## 2022-08-06 ENCOUNTER — Ambulatory Visit (INDEPENDENT_AMBULATORY_CARE_PROVIDER_SITE_OTHER): Payer: 59 | Admitting: Podiatry

## 2022-08-06 ENCOUNTER — Encounter: Payer: Self-pay | Admitting: Podiatry

## 2022-08-06 DIAGNOSIS — Z9889 Other specified postprocedural states: Secondary | ICD-10-CM | POA: Diagnosis not present

## 2022-08-06 NOTE — Progress Notes (Signed)
Subjective:   Patient ID: Ana Gross, female   DOB: 52 y.o.   MRN: [id number]   HPI Patient states she is doing very well with surgery but she does get some numbness and some occasional swelling   ROS      Objective:  Physical Exam  Neurovascular status intact negative Bevelyn Buckles' sign noted wound edges healing very well good alignment first and fifth metatarsal good range of motion no crepitus joint mild edema     Assessment:  Doing well post forefoot reconstruction right     Plan:  Reviewed range of motion exercises continued elevation compression and continued immobilization.  Reappoint 4 weeks earlier if any issues were to occur  X-rays indicate that the osteotomy is healing well good alignment noted fixation in place slight stress on the fifth metatarsal but I do think overall it will heal uneventfully at this position and we will continue immobilization for the next few weeks

## 2022-08-11 ENCOUNTER — Ambulatory Visit: Payer: 59 | Admitting: Family Medicine

## 2022-08-11 VITALS — BP 129/82 | HR 99 | Temp 97.9°F | Wt 146.0 lb

## 2022-08-11 DIAGNOSIS — J019 Acute sinusitis, unspecified: Secondary | ICD-10-CM | POA: Diagnosis not present

## 2022-08-11 MED ORDER — DOXYCYCLINE HYCLATE 100 MG PO TABS: 100.0000 mg | ORAL_TABLET | Freq: Two times a day (BID) | ORAL | 0 refills | Status: DC

## 2022-08-11 NOTE — Progress Notes (Signed)
Subjective:  Patient ID: Ana Gross, female    DOB: 04/05/1971  Age: 52 y.o. MRN: [id number]  CC: Chief Complaint  Patient presents with   Sinusitis    Pt unsure if she has sinus infection or allergic reaction to new hairspray she tried last Wednesday. Pt having burning in throat,sinus congestion, runny nose and sinus pressure.    HPI:  52 year old female presents for evaluation the above.  Patient states that she has not been feeling well since Wednesday.  She states that she used a new hairspray and felt that that caused her symptoms.  She took some Benadryl and seem to be improved but then her symptoms worsened again.  She reports scratchy throat, runny nose, and severe sinus congestion.  She has used Afrin, saline rinse, Claritin, Sudafed, and Benadryl without significant improvement.  No fever.  She has had night sweats.  She took a home COVID test which was negative.  She is very concerned especially given the fact that she takes care of her mother-in-law who is a cancer patient.  Patient Active Problem List   Diagnosis Date Noted   Acute rhinosinusitis 08/11/2022   Overweight (BMI 25.0-29.9) 02/17/2022   Edema of right lower extremity 02/17/2022   GERD (gastroesophageal reflux disease) 08/12/2011    Social Hx   Social History   Socioeconomic History   Marital status: Married    Spouse name: Not on file   Number of children: 2   Years of education: 14   Highest education level: Not on file  Occupational History    Employer: [employer]  Tobacco Use   Smoking status: Never   Smokeless tobacco: Never  Vaping Use   Vaping Use: Never used  Substance and Sexual Activity   Alcohol use: Yes    Comment: she drinks about once or twice a year.   Drug use: No   Sexual activity: Yes    Partners: Male    Comment: Married  Other Topics Concern   Not on file  Social History Narrative   Lives at home   Right-handed   Caffeine: drinks a glass of unsweet tea  each day   Social Determinants of Health   Financial Resource Strain: Not on file  Food Insecurity: Not on file  Transportation Needs: Not on file  Physical Activity: Not on file  Stress: Not on file  Social Connections: Not on file    Review of Systems Per HPI  Objective:  BP 129/82   Pulse 99   Temp 97.9 F (36.6 C)   Wt 146 lb (66.2 kg)   SpO2 96%   BMI 29.49 kg/m      08/11/2022    1:54 PM 02/14/2022    9:22 AM 01/31/2022    4:48 PM  BP/Weight  Systolic BP 401 027 253  Diastolic BP 82 83 84  Wt. (Lbs) 146 141.2   BMI 29.49 kg/m2 28.52 kg/m2     Physical Exam Vitals and nursing note reviewed.  Constitutional:      General: She is not in acute distress.    Appearance: Normal appearance.  HENT:     Head: Normocephalic and atraumatic.     Right Ear: Tympanic membrane normal.     Left Ear: Tympanic membrane normal.     Nose: No rhinorrhea.     Mouth/Throat:     Pharynx: Oropharynx is clear.  Eyes:     General:        Right eye: No discharge.  Left eye: No discharge.     Conjunctiva/sclera: Conjunctivae normal.  Cardiovascular:     Rate and Rhythm: Normal rate and regular rhythm.  Pulmonary:     Effort: Pulmonary effort is normal.     Breath sounds: Normal breath sounds.  Neurological:     Mental Status: She is alert.  Psychiatric:        Mood and Affect: Mood normal.        Behavior: Behavior normal.     Lab Results  Component Value Date   WBC 4.4 02/14/2022   HGB 13.5 02/14/2022   HCT 40.9 02/14/2022   PLT 268 02/14/2022   GLUCOSE 92 02/14/2022   CHOL 182 02/14/2022   TRIG 89 02/14/2022   HDL 58 02/14/2022   LDLCALC 108 (H) 02/14/2022   ALT 12 02/14/2022   AST 14 02/14/2022   NA 142 02/14/2022   K 4.2 02/14/2022   CL 103 02/14/2022   CREATININE 0.70 02/14/2022   BUN 14 02/14/2022   CO2 24 02/14/2022   TSH 1.200 08/19/2014     Assessment & Plan:   Problem List Items Addressed This Visit       Respiratory   Acute  rhinosinusitis - Primary    Treating empirically with doxycycline. Awaiting COVID, flu, RSV testing.      Relevant Medications   doxycycline (VIBRA-TABS) 100 MG tablet   Other Relevant Orders   COVID-19, Flu A+B and RSV    Meds ordered this encounter  Medications   doxycycline (VIBRA-TABS) 100 MG tablet    Sig: Take 1 tablet (100 mg total) by mouth 2 (two) times daily.    Dispense:  14 tablet    Refill:  0    Follow-up:  Return if symptoms worsen or fail to improve.  [place]

## 2022-08-11 NOTE — Assessment & Plan Note (Signed)
Treating empirically with doxycycline. Awaiting COVID, flu, RSV testing.

## 2022-08-13 LAB — COVID-19, FLU A+B AND RSV
Influenza A, NAA: NOT DETECTED
Influenza B, NAA: DETECTED — AB
RSV, NAA: NOT DETECTED
SARS-CoV-2, NAA: NOT DETECTED

## 2022-09-03 ENCOUNTER — Ambulatory Visit (INDEPENDENT_AMBULATORY_CARE_PROVIDER_SITE_OTHER): Payer: 59

## 2022-09-03 ENCOUNTER — Ambulatory Visit (INDEPENDENT_AMBULATORY_CARE_PROVIDER_SITE_OTHER): Payer: 59 | Admitting: Podiatry

## 2022-09-03 DIAGNOSIS — M21611 Bunion of right foot: Secondary | ICD-10-CM | POA: Diagnosis not present

## 2022-09-03 DIAGNOSIS — M779 Enthesopathy, unspecified: Secondary | ICD-10-CM

## 2022-09-03 DIAGNOSIS — M21619 Bunion of unspecified foot: Secondary | ICD-10-CM

## 2022-09-03 NOTE — Progress Notes (Signed)
Subjective:   Patient ID: Ana Gross, female   DOB: 52 y.o.   MRN: [id number]   HPI Patient states still getting swelling in her right foot even though it is gradually improving having trouble wearing any kind of shoe and knows she is going to need no inserts in the future due to the floor she walks on the change in her gait neurovascular status   ROS      Objective:  Physical Exam  Intact negative Bevelyn Buckles' sign noted wound edges coapted well range of motion adequate no crepitus of the joint surface noted with patient who still needs to work on the range of motion of the big toe joint right     Assessment:  Doing well post osteotomy first fifth metatarsal right moderate swelling still noted     Plan:  H&P x-ray reviewed due to the fact she has to wear steel toes it is probably can to be at minimum another 3 to 4 weeks before she will be able to start wearing them and it may take a little bit longer.  Patient will have new orthotics made will be see her back in 8 weeks  X-rays indicate osteotomies are healing well fixation in place joint congruence

## 2022-09-30 ENCOUNTER — Encounter: Payer: Self-pay | Admitting: Podiatry

## 2022-10-29 ENCOUNTER — Ambulatory Visit (INDEPENDENT_AMBULATORY_CARE_PROVIDER_SITE_OTHER): Payer: 59 | Admitting: Podiatry

## 2022-10-29 ENCOUNTER — Ambulatory Visit (INDEPENDENT_AMBULATORY_CARE_PROVIDER_SITE_OTHER): Payer: 59

## 2022-10-29 ENCOUNTER — Encounter: Payer: Self-pay | Admitting: Podiatry

## 2022-10-29 DIAGNOSIS — M21619 Bunion of unspecified foot: Secondary | ICD-10-CM

## 2022-10-29 DIAGNOSIS — M7661 Achilles tendinitis, right leg: Secondary | ICD-10-CM

## 2022-10-29 DIAGNOSIS — M7662 Achilles tendinitis, left leg: Secondary | ICD-10-CM | POA: Diagnosis not present

## 2022-10-29 DIAGNOSIS — M21611 Bunion of right foot: Secondary | ICD-10-CM | POA: Diagnosis not present

## 2022-10-29 MED ORDER — DICLOFENAC SODIUM 75 MG PO TBEC: 75.0000 mg | DELAYED_RELEASE_TABLET | Freq: Two times a day (BID) | ORAL | 2 refills | Status: DC

## 2022-10-29 NOTE — Patient Instructions (Signed)

## 2022-10-29 NOTE — Progress Notes (Signed)
Subjective:   Patient ID: Ana Gross, female   DOB: 52 y.o.   MRN: [id number]   HPI Patient states overall doing well still getting some soreness getting some pain in the back of my right heel and I know I need new orthotics with my job and the pain I get in my arch   ROS      Objective:  Physical Exam  Neurovasc status intact negative Denna Haggard' sign noted wound edges well coapted range of motion good with moderate swelling midfoot and discomfort in the Achilles tendon insertion right     Assessment:  Overall doing well does have Achilles tendinitis right mild nature secondary to probable acute gait change with mild swelling and does have moderate changes of the arch with requirements for orthotics     Plan:  H&P reviewed recommended continued exercises elevation and Achilles tendon exercises which were given to her today and placed on diclofenac 75 mg twice daily for the posterior pain.  Casted for functional orthotics to help elevate the arch and take pressure off her feet and we will get those dispensed to her soon as possible  X-rays indicate osteotomies are healing well fixation in place joint congruence

## 2022-12-01 ENCOUNTER — Other Ambulatory Visit (HOSPITAL_COMMUNITY)
Admission: RE | Admit: 2022-12-01 | Discharge: 2022-12-01 | Disposition: A | Discharge status: Home / Self Care | Payer: 59 | Source: Ambulatory Visit | Attending: Family Medicine | Admitting: Family Medicine

## 2022-12-01 ENCOUNTER — Ambulatory Visit (INDEPENDENT_AMBULATORY_CARE_PROVIDER_SITE_OTHER): Payer: 59 | Admitting: Family Medicine

## 2022-12-01 VITALS — BP 120/68 | HR 84 | Temp 97.9°F | Ht 59.0 in | Wt 149.0 lb

## 2022-12-01 DIAGNOSIS — R1084 Generalized abdominal pain: Secondary | ICD-10-CM | POA: Diagnosis present

## 2022-12-01 DIAGNOSIS — R509 Fever, unspecified: Secondary | ICD-10-CM | POA: Insufficient documentation

## 2022-12-01 LAB — COMPREHENSIVE METABOLIC PANEL
ALT: 38 U/L (ref 0–44)
AST: 24 U/L (ref 15–41)
Albumin: 4.2 g/dL (ref 3.5–5.0)
Alkaline Phosphatase: 97 U/L (ref 38–126)
Anion gap: 10 (ref 5–15)
BUN: 21 mg/dL — ABNORMAL HIGH (ref 6–20)
CO2: 26 mmol/L (ref 22–32)
Calcium: 8.9 mg/dL (ref 8.9–10.3)
Chloride: 101 mmol/L (ref 98–111)
Creatinine, Ser: 0.75 mg/dL (ref 0.44–1.00)
GFR, Estimated: >60 mL/min (ref >60)
Glucose, Bld: 103 mg/dL — ABNORMAL HIGH (ref 70–99)
Potassium: 4.2 mmol/L (ref 3.5–5.1)
Sodium: 137 mmol/L (ref 135–145)
Total Bilirubin: 0.7 mg/dL (ref 0.3–1.2)
Total Protein: 7.2 g/dL (ref 6.5–8.1)

## 2022-12-01 LAB — CBC WITH DIFFERENTIAL/PLATELET
Abs Immature Granulocytes: 0.07 10*3/uL (ref 0.00–0.07)
Basophils Absolute: 0 10*3/uL (ref 0.0–0.1)
Basophils Relative: 1 %
Eosinophils Absolute: 0.2 10*3/uL (ref 0.0–0.5)
Eosinophils Relative: 3 %
HCT: 41.8 % (ref 36.0–46.0)
Hemoglobin: 13.6 g/dL (ref 12.0–15.0)
Immature Granulocytes: 1 %
Lymphocytes Relative: 34 %
Lymphs Abs: 2.2 10*3/uL (ref 0.7–4.0)
MCH: 30 pg (ref 26.0–34.0)
MCHC: 32.5 g/dL (ref 30.0–36.0)
MCV: 92.3 fL (ref 80.0–100.0)
Monocytes Absolute: 0.5 10*3/uL (ref 0.1–1.0)
Monocytes Relative: 7 %
Neutro Abs: 3.5 10*3/uL (ref 1.7–7.7)
Neutrophils Relative %: 54 %
Platelets: 285 10*3/uL (ref 150–400)
RBC: 4.53 MIL/uL (ref 3.87–5.11)
RDW: 12.8 % (ref 11.5–15.5)
WBC: 6.4 10*3/uL (ref 4.0–10.5)
nRBC: 0 % (ref 0.0–0.2)

## 2022-12-01 LAB — LIPASE, BLOOD: Lipase: 39 U/L (ref 11–51)

## 2022-12-01 NOTE — Assessment & Plan Note (Signed)
Etiology unclear at this time.  However, given reports of acholic stool as well as fever and night sweats I am quite concerned.  Proceeding with labs for further evaluation.  Also proceeding with CT scan for further evaluation.  This is scheduled for tomorrow.

## 2022-12-01 NOTE — Patient Instructions (Signed)
Labs today.   I will call with results.  We will call about the CT.

## 2022-12-01 NOTE — Progress Notes (Signed)
Subjective:  Patient ID: Ana Gross, female    DOB: 16-Sep-1970  Age: 52 y.o. MRN: [id number]  CC: Chief Complaint  Patient presents with   Abdominal Pain    2 episodes in 2 months abd pain, nausea, frequent , loose stools, fever, constipation, gas . Still fatigued withupset stomach, abd  pain loose stool    HPI:  51 year old female presents for evaluation of the above.  Patient reports that she has had 2 recent bouts of symptoms.  She states that on 4/16 she had frequent stools, nausea, and some abdominal discomfort.  Lasted briefly and resolved.  Occurred again on 5/2.  However, this time she had fever in addition to abdominal pain as well as gas and bloating.  She has had some diarrhea.  Fever has been as high as 101.8.  It lasted approximately 2 days.  She states that since that time she has been fatigued.  She has had some recent left upper quadrant abdominal pain on Friday.  She states that yesterday she had a light-colored stool that looked white.  She has had recent night sweats.  No reports of weight loss.  Overall she feels poorly.  No known relieving factors.  Patient Active Problem List   Diagnosis Date Noted   Generalized abdominal pain 12/01/2022   Fever 12/01/2022   GERD (gastroesophageal reflux disease) 08/12/2011    Social Hx   Social History   Socioeconomic History   Marital status: Married    Spouse name: Not on file   Number of children: 2   Years of education: 14   Highest education level: 12th grade  Occupational History    Employer: [employer]  Tobacco Use   Smoking status: Never   Smokeless tobacco: Never  Vaping Use   Vaping Use: Never used  Substance and Sexual Activity   Alcohol use: Yes    Comment: she drinks about once or twice a year.   Drug use: No   Sexual activity: Yes    Partners: Male    Comment: Married  Other Topics Concern   Not on file  Social History Narrative   Lives at home   Right-handed   Caffeine: drinks a  glass of unsweet tea each day   Social Determinants of Health   Financial Resource Strain: Low Risk  (12/01/2022)   Overall Financial Resource Strain (CARDIA)    Difficulty of Paying Living Expenses: Not hard at all  Food Insecurity: No Food Insecurity (12/01/2022)   Hunger Vital Sign    Worried About Running Out of Food in the Last Year: Never true    Ran Out of Food in the Last Year: Never true  Transportation Needs: No Transportation Needs (12/01/2022)   PRAPARE - Administrator, Civil Service (Medical): No    Lack of Transportation (Non-Medical): No  Physical Activity: Insufficiently Active (12/01/2022)   Exercise Vital Sign    Days of Exercise per Week: 3 days    Minutes of Exercise per Session: 30 min  Stress: No Stress Concern Present (12/01/2022)   [employer] of Occupational Health - Occupational Stress Questionnaire    Feeling of Stress : Not at all  Social Connections: Moderately Integrated (12/01/2022)   Social Connection and Isolation Panel [NHANES]    Frequency of Communication with Friends and Family: More than three times a week    Frequency of Social Gatherings with Friends and Family: Once a week    Attends Religious Services: 1 to 4 times  per year    Active Member of Clubs or Organizations: No    Attends Engineer, structural: Not on file    Marital Status: Married    Review of Systems Per HPI  Objective:  BP 120/68   Pulse 84   Temp 97.9 F (36.6 C)   Ht 4\' 11"  (1.499 m)   Wt 149 lb (67.6 kg)   SpO2 96%   BMI 30.09 kg/m      12/01/2022    3:43 PM 08/11/2022    1:54 PM 02/14/2022    9:22 AM  BP/Weight  Systolic BP 120 129 124  Diastolic BP 68 82 83  Wt. (Lbs) 149 146 141.2  BMI 30.09 kg/m2 29.49 kg/m2 28.52 kg/m2    Physical Exam Vitals and nursing note reviewed.  Constitutional:      General: She is not in acute distress. HENT:     Head: Normocephalic and atraumatic.  Eyes:     General: No scleral icterus.        Right eye: No discharge.        Left eye: No discharge.     Conjunctiva/sclera: Conjunctivae normal.  Cardiovascular:     Rate and Rhythm: Normal rate and regular rhythm.  Pulmonary:     Effort: Pulmonary effort is normal.     Breath sounds: Normal breath sounds. No wheezing, rhonchi or rales.  Abdominal:     General: There is no distension.     Palpations: Abdomen is soft.     Comments: Mild epigastric tenderness to palpation.  Neurological:     Mental Status: She is alert.  Psychiatric:        Mood and Affect: Mood normal.        Behavior: Behavior normal.     Lab Results  Component Value Date   WBC 4.4 02/14/2022   HGB 13.5 02/14/2022   HCT 40.9 02/14/2022   PLT 268 02/14/2022   GLUCOSE 92 02/14/2022   CHOL 182 02/14/2022   TRIG 89 02/14/2022   HDL 58 02/14/2022   LDLCALC 108 (H) 02/14/2022   ALT 12 02/14/2022   AST 14 02/14/2022   NA 142 02/14/2022   K 4.2 02/14/2022   CL 103 02/14/2022   CREATININE 0.70 02/14/2022   BUN 14 02/14/2022   CO2 24 02/14/2022   TSH 1.200 08/19/2014     Assessment & Plan:   Problem List Items Addressed This Visit       Other   Fever   Relevant Orders   CT Abdomen Pelvis Wo Contrast   CBC with Differential   Comprehensive metabolic panel   Lipase   Generalized abdominal pain - Primary    Etiology unclear at this time.  However, given reports of acholic stool as well as fever and night sweats I am quite concerned.  Proceeding with labs for further evaluation.  Also proceeding with CT scan for further evaluation.  This is scheduled for tomorrow.      Relevant Orders   CT Abdomen Pelvis Wo Contrast   CBC with Differential   Comprehensive metabolic panel   Lipase    Follow-up:  Pending workup  Emanual Lamountain Adriana Simas DO [facility]

## 2022-12-02 ENCOUNTER — Ambulatory Visit (HOSPITAL_COMMUNITY)
Admission: RE | Admit: 2022-12-02 | Discharge: 2022-12-02 | Disposition: A | Discharge status: Home / Self Care | Payer: 59 | Source: Ambulatory Visit | Attending: Family Medicine | Admitting: Family Medicine

## 2022-12-02 DIAGNOSIS — R509 Fever, unspecified: Secondary | ICD-10-CM | POA: Diagnosis present

## 2022-12-02 DIAGNOSIS — R1084 Generalized abdominal pain: Secondary | ICD-10-CM | POA: Insufficient documentation

## 2022-12-04 LAB — HM MAMMOGRAPHY

## 2022-12-22 ENCOUNTER — Encounter: Payer: Self-pay | Admitting: Podiatry

## 2023-01-08 ENCOUNTER — Encounter: Payer: Self-pay | Admitting: Podiatry

## 2023-01-10 NOTE — Telephone Encounter (Signed)
Carney Bern, can you please follow up on her orthotics? Thanks!

## 2023-02-25 NOTE — Progress Notes (Signed)
This encounter was created in error - please disregard.

## 2023-05-26 ENCOUNTER — Encounter: Payer: Self-pay | Admitting: Podiatry

## 2023-06-24 ENCOUNTER — Encounter: Payer: Self-pay | Admitting: Family Medicine

## 2023-11-19 ENCOUNTER — Ambulatory Visit (INDEPENDENT_AMBULATORY_CARE_PROVIDER_SITE_OTHER): Admitting: Family Medicine

## 2023-11-19 ENCOUNTER — Encounter: Payer: Self-pay | Admitting: Family Medicine

## 2023-11-19 VITALS — BP 117/74 | HR 55 | Wt 147.2 lb

## 2023-11-19 DIAGNOSIS — E785 Hyperlipidemia, unspecified: Secondary | ICD-10-CM

## 2023-11-19 DIAGNOSIS — R5383 Other fatigue: Secondary | ICD-10-CM | POA: Insufficient documentation

## 2023-11-19 NOTE — Patient Instructions (Signed)
Labs today.   We will call with results.  Take care  Dr. Athen Riel  

## 2023-11-20 ENCOUNTER — Encounter: Payer: Self-pay | Admitting: Family Medicine

## 2023-11-20 LAB — CMP14+EGFR
ALT: 11 IU/L (ref 0–32)
AST: 18 IU/L (ref 0–40)
Albumin: 4.7 g/dL (ref 3.8–4.9)
Alkaline Phosphatase: 100 IU/L (ref 44–121)
BUN/Creatinine Ratio: 17 (ref 9–23)
BUN: 17 mg/dL (ref 6–24)
Bilirubin Total: 0.2 mg/dL (ref 0.0–1.2)
CO2: 24 mmol/L (ref 20–29)
Calcium: 9.5 mg/dL (ref 8.7–10.2)
Chloride: 101 mmol/L (ref 96–106)
Creatinine, Ser: 0.99 mg/dL (ref 0.57–1.00)
Globulin, Total: 2.3 g/dL (ref 1.5–4.5)
Glucose: 91 mg/dL (ref 70–99)
Potassium: 4.2 mmol/L (ref 3.5–5.2)
Sodium: 141 mmol/L (ref 134–144)
Total Protein: 7 g/dL (ref 6.0–8.5)
eGFR: 69 mL/min/{1.73_m2} (ref >59)

## 2023-11-20 LAB — VITAMIN B12: Vitamin B-12: 500 pg/mL (ref 232–1245)

## 2023-11-20 LAB — LIPID PANEL
Chol/HDL Ratio: 3.3 ratio (ref 0.0–4.4)
Cholesterol, Total: 175 mg/dL (ref 100–199)
HDL: 53 mg/dL (ref >39)
LDL Chol Calc (NIH): 107 mg/dL — ABNORMAL HIGH (ref 0–99)
Triglycerides: 79 mg/dL (ref 0–149)
VLDL Cholesterol Cal: 15 mg/dL (ref 5–40)

## 2023-11-20 LAB — CBC
Hematocrit: 42.7 % (ref 34.0–46.6)
Hemoglobin: 13.6 g/dL (ref 11.1–15.9)
MCH: 29.8 pg (ref 26.6–33.0)
MCHC: 31.9 g/dL (ref 31.5–35.7)
MCV: 94 fL (ref 79–97)
Platelets: 264 10*3/uL (ref 150–450)
RBC: 4.56 x10E6/uL (ref 3.77–5.28)
RDW: 12.3 % (ref 11.7–15.4)
WBC: 6 10*3/uL (ref 3.4–10.8)

## 2023-11-20 LAB — VITAMIN D 25 HYDROXY (VIT D DEFICIENCY, FRACTURES): Vit D, 25-Hydroxy: 40.6 ng/mL (ref 30.0–100.0)

## 2023-11-20 LAB — TSH: TSH: 1.42 u[IU]/mL (ref 0.450–4.500)

## 2023-11-20 NOTE — Progress Notes (Signed)
 Subjective:  Patient ID: Ana Gross, female    DOB: 04/22/1971  Age: 53 y.o. MRN: [id number]  CC: Fatigue  HPI:  53 year old female presents for evaluation of the above.  Patient reports recent development of fatigue.  Started over the past few days.  She reports low energy/fatigue.  She states that she just does not have much energy.  She states that she has changed her diet and is concerned that she may be deficient in something.  She is requesting labs to be obtained today.  No other complaints or concerns at this time.  Of note, patient states that she has poor sleep.  Patient Active Problem List   Diagnosis Date Noted   Hyperlipidemia 11/19/2023   Fatigue 11/19/2023   GERD (gastroesophageal reflux disease) 08/12/2011    Social Hx   Social History   Socioeconomic History   Marital status: Married    Spouse name: Not on file   Number of children: 2   Years of education: 14   Highest education level: 12th grade  Occupational History    Employer: [employer]  Tobacco Use   Smoking status: Never   Smokeless tobacco: Never  Vaping Use   Vaping status: Never Used  Substance and Sexual Activity   Alcohol use: Yes    Comment: she drinks about once or twice a year.   Drug use: No   Sexual activity: Yes    Partners: Male    Comment: Married  Other Topics Concern   Not on file  Social History Narrative   Lives at home   Right-handed   Caffeine: drinks a glass of unsweet tea each day   Social Drivers of Health   Financial Resource Strain: Low Risk  (12/01/2022)   Overall Financial Resource Strain (CARDIA)    Difficulty of Paying Living Expenses: Not hard at all  Food Insecurity: No Food Insecurity (12/01/2022)   Hunger Vital Sign    Worried About Running Out of Food in the Last Year: Never true    Ran Out of Food in the Last Year: Never true  Transportation Needs: No Transportation Needs (12/01/2022)   PRAPARE - Administrator, Civil Service  (Medical): No    Lack of Transportation (Non-Medical): No  Physical Activity: Insufficiently Active (12/01/2022)   Exercise Vital Sign    Days of Exercise per Week: 3 days    Minutes of Exercise per Session: 30 min  Stress: No Stress Concern Present (12/01/2022)   [employer] of Occupational Health - Occupational Stress Questionnaire    Feeling of Stress : Not at all  Social Connections: Moderately Integrated (12/01/2022)   Social Connection and Isolation Panel [NHANES]    Frequency of Communication with Friends and Family: More than three times a week    Frequency of Social Gatherings with Friends and Family: Once a week    Attends Religious Services: 1 to 4 times per year    Active Member of [employer] or Organizations: No    Attends Engineer, structural: Not on file    Marital Status: Married    Review of Systems Per HPI  Objective:  BP 117/74   Pulse (!) 55   Wt 147 lb 3.2 oz (66.8 kg)   SpO2 (!) 84%   BMI 29.73 kg/m      11/19/2023    3:24 PM 12/01/2022    3:43 PM 08/11/2022    1:54 PM  BP/Weight  Systolic BP 117 120 129  Diastolic  BP 74 68 82  Wt. (Lbs) 147.2 149 146  BMI 29.73 kg/m2 30.09 kg/m2 29.49 kg/m2    Physical Exam Vitals and nursing note reviewed.  Constitutional:      General: She is not in acute distress.    Appearance: Normal appearance.  HENT:     Head: Normocephalic and atraumatic.  Eyes:     General:        Right eye: No discharge.        Left eye: No discharge.     Conjunctiva/sclera: Conjunctivae normal.  Cardiovascular:     Rate and Rhythm: Normal rate and regular rhythm.  Pulmonary:     Effort: Pulmonary effort is normal.     Breath sounds: Normal breath sounds. No wheezing, rhonchi or rales.  Neurological:     Mental Status: She is alert.  Psychiatric:        Mood and Affect: Mood normal.        Behavior: Behavior normal.     Lab Results  Component Value Date   WBC 6.0 11/19/2023   HGB 13.6 11/19/2023   HCT 42.7  11/19/2023   PLT 264 11/19/2023   GLUCOSE 91 11/19/2023   CHOL 175 11/19/2023   TRIG 79 11/19/2023   HDL 53 11/19/2023   LDLCALC 107 (H) 11/19/2023   ALT 11 11/19/2023   AST 18 11/19/2023   NA 141 11/19/2023   K 4.2 11/19/2023   CL 101 11/19/2023   CREATININE 0.99 11/19/2023   BUN 17 11/19/2023   CO2 24 11/19/2023   TSH 1.420 11/19/2023     Assessment & Plan:  Fatigue, unspecified type Assessment & Plan: Labs for further evaluation today.  Orders: -     CBC -     CMP14+EGFR -     TSH -     Vitamin B12 -     VITAMIN D 25 Hydroxy (Vit-D Deficiency, Fractures)  Hyperlipidemia, unspecified hyperlipidemia type -     Lipid panel    Follow-up:  Return if symptoms worsen or fail to improve.  Kathleen Papa DO [facility]

## 2023-11-20 NOTE — Assessment & Plan Note (Signed)
Labs for further evaluation today.

## 2024-06-09 ENCOUNTER — Ambulatory Visit: Payer: Self-pay

## 2024-06-09 ENCOUNTER — Ambulatory Visit: Admitting: Family Medicine

## 2024-06-09 VITALS — BP 113/71 | HR 80 | Temp 98.4°F | Ht 59.0 in | Wt 149.0 lb

## 2024-06-09 DIAGNOSIS — J019 Acute sinusitis, unspecified: Secondary | ICD-10-CM | POA: Insufficient documentation

## 2024-06-09 DIAGNOSIS — J0140 Acute pansinusitis, unspecified: Secondary | ICD-10-CM | POA: Diagnosis not present

## 2024-06-09 MED ORDER — DOXYCYCLINE HYCLATE 100 MG PO CAPS
100.0000 mg | ORAL_CAPSULE | Freq: Two times a day (BID) | ORAL | 0 refills | Status: AC
Start: 2024-06-09 — End: ?

## 2024-06-09 NOTE — Progress Notes (Signed)
 Subjective:  Patient ID: Ana Gross, female    DOB: 14-Jan-1971  Age: 53 y.o. MRN: [id number]  CC:   Chief Complaint  Patient presents with   sinus congestion     Facial pain, tooth pain , ear pain  Chills , fever 3 am today, scratchy throat, drainage at back of throat, headache    HPI:  53 year old female presents for evaluation of the above.   Symptoms started Friday. Reports sinus pressure, congestion, headache. Ear pain as well. Subjective fever as well as chills. Also reports postnasal drip. No relieving factors.   Patient Active Problem List   Diagnosis Date Noted   Acute sinusitis 06/09/2024   Hyperlipidemia 11/19/2023   GERD (gastroesophageal reflux disease) 08/12/2011    Social Hx   Social History   Socioeconomic History   Marital status: Married    Spouse name: Not on file   Number of children: 2   Years of education: 14   Highest education level: Some college, no degree  Occupational History    Employer: [employer]  Tobacco Use   Smoking status: Never   Smokeless tobacco: Never  Vaping Use   Vaping status: Never Used  Substance and Sexual Activity   Alcohol use: Yes    Comment: she drinks about once or twice a year.   Drug use: No   Sexual activity: Yes    Partners: Male    Comment: Married  Other Topics Concern   Not on file  Social History Narrative   Lives at home   Right-handed   Caffeine: drinks a glass of unsweet tea each day   Social Drivers of Health   Financial Resource Strain: Low Risk  (06/09/2024)   Overall Financial Resource Strain (CARDIA)    Difficulty of Paying Living Expenses: Not hard at all  Food Insecurity: No Food Insecurity (06/09/2024)   Hunger Vital Sign    Worried About Running Out of Food in the Last Year: Never true    Ran Out of Food in the Last Year: Never true  Transportation Needs: No Transportation Needs (06/09/2024)   PRAPARE - Administrator, Civil Service (Medical): No    Lack of  Transportation (Non-Medical): No  Physical Activity: Insufficiently Active (06/09/2024)   Exercise Vital Sign    Days of Exercise per Week: 3 days    Minutes of Exercise per Session: 30 min  Stress: No Stress Concern Present (06/09/2024)   [employer] of Occupational Health - Occupational Stress Questionnaire    Feeling of Stress: Not at all  Social Connections: Moderately Isolated (06/09/2024)   Social Connection and Isolation Panel    Frequency of Communication with Friends and Family: More than three times a week    Frequency of Social Gatherings with Friends and Family: Three times a week    Attends Religious Services: Patient declined    Active Member of Clubs or Organizations: No    Attends Engineer, Structural: Not on file    Marital Status: Married    Review of Systems Per HPI  Objective:  BP 113/71   Pulse 80   Temp 98.4 F (36.9 C)   Ht 4' 11 (1.499 m)   Wt 149 lb (67.6 kg)   SpO2 95%   BMI 30.09 kg/m      06/09/2024    3:48 PM 11/19/2023    3:24 PM 12/01/2022    3:43 PM  BP/Weight  Systolic BP 113 117 120  Diastolic  BP 71 74 68  Wt. (Lbs) 149 147.2 149  BMI 30.09 kg/m2 29.73 kg/m2 30.09 kg/m2    Physical Exam Vitals and nursing note reviewed.  Constitutional:      General: She is not in acute distress.    Appearance: Normal appearance.  HENT:     Head: Normocephalic and atraumatic.     Right Ear: Tympanic membrane normal.     Ears:     Comments: Left TM effusion.    Nose: Congestion present.     Mouth/Throat:     Pharynx: Oropharynx is clear.  Cardiovascular:     Rate and Rhythm: Normal rate and regular rhythm.  Pulmonary:     Effort: Pulmonary effort is normal.     Breath sounds: Normal breath sounds. No wheezing or rales.  Neurological:     Mental Status: She is alert.     Lab Results  Component Value Date   WBC 6.0 11/19/2023   HGB 13.6 11/19/2023   HCT 42.7 11/19/2023   PLT 264 11/19/2023   GLUCOSE 91 11/19/2023    CHOL 175 11/19/2023   TRIG 79 11/19/2023   HDL 53 11/19/2023   LDLCALC 107 (H) 11/19/2023   ALT 11 11/19/2023   AST 18 11/19/2023   NA 141 11/19/2023   K 4.2 11/19/2023   CL 101 11/19/2023   CREATININE 0.99 11/19/2023   BUN 17 11/19/2023   CO2 24 11/19/2023   TSH 1.420 11/19/2023     Assessment & Plan:  Acute pansinusitis, recurrence not specified Assessment & Plan: Treating with Doxy.  Orders: -     Doxycycline  Hyclate; Take 1 capsule (100 mg total) by mouth 2 (two) times daily.  Dispense: 14 capsule; Refill: 0    Follow-up:  Return if symptoms worsen or fail to improve.  Jacqulyn Ahle DO [facility]

## 2024-06-09 NOTE — Assessment & Plan Note (Signed)
Treating with Doxy. 

## 2024-06-09 NOTE — Telephone Encounter (Signed)
 FYI Only or Action Required?: FYI only for provider: appointment scheduled on 06/09/2024.  Patient was last seen in primary care on 11/19/2023 by Cook, Ana G, DO.  Called Nurse Triage reporting Sinus Problem.  Symptoms began several days ago.  Interventions attempted: Other: Nasal spray.  Symptoms are: gradually worsening.  Triage Disposition: See Physician Within 24 Hours  Patient/caregiver understands and will follow disposition?: Yes           Message from Olam RAMAN sent at 06/09/2024  8:14 AM EST  Reason for Triage:  Pt would like to be seen today for sinus cold/infection 101 fever not sure if it is a sinus infection. Today no fever   Reason for Disposition  [1] Sinus pain (not just congestion) AND [2] fever  Answer Assessment - Initial Assessment Questions 1. LOCATION: Where does it hurt?      Behind eyes in sinus area  2. ONSET: When did the sinus pain start?  (e.Gross., hours, days)      Friday  3. SEVERITY: How bad is the pain?   (Scale 0-10; or Ana Gross, mild, moderate or severe)     Moderate  4. RECURRENT SYMPTOM: Have you ever had sinus problems before? If Yes, ask: When was the last time? and What happened that time?      Yes  5. NASAL CONGESTION: Is the nose blocked? If Yes, ask: Can you open it or must you breathe through your mouth?     Yes, has nasal congestion  6. NASAL DISCHARGE: Do you have discharge from your nose? If so ask, What color?     Clear in color some slight blood once  7. FEVER: Do you have a fever? If Yes, ask: What is it, how was it measured, and when did it start?      Recent temp was 101  8. OTHER SYMPTOMS: Do you have any other symptoms? (e.Gross., sore throat, cough, earache, difficulty breathing)     Chills  Using a nasal spray for symptoms.  Protocols used: Sinus Pain or Congestion-A-AH
